# Patient Record
Sex: Female | Born: 1960 | Race: Black or African American | Hispanic: No | Marital: Single | Smoking: Never smoker
Health system: Southern US, Community
[De-identification: ages and names within clinical notes are randomized; demographics above are authoritative.]

## PROBLEM LIST (undated history)

## (undated) DIAGNOSIS — K219 Gastro-esophageal reflux disease without esophagitis: Secondary | ICD-10-CM

## (undated) DIAGNOSIS — G40909 Epilepsy, unspecified, not intractable, without status epilepticus: Secondary | ICD-10-CM

## (undated) DIAGNOSIS — U071 COVID-19: Secondary | ICD-10-CM

## (undated) DIAGNOSIS — G4730 Sleep apnea, unspecified: Secondary | ICD-10-CM

## (undated) DIAGNOSIS — D259 Leiomyoma of uterus, unspecified: Secondary | ICD-10-CM

## (undated) DIAGNOSIS — B007 Disseminated herpesviral disease: Secondary | ICD-10-CM

## (undated) DIAGNOSIS — Z8742 Personal history of other diseases of the female genital tract: Secondary | ICD-10-CM

## (undated) HISTORY — DX: Epilepsy, unspecified, not intractable, without status epilepticus: G40.909

## (undated) HISTORY — DX: COVID-19: U07.1

## (undated) HISTORY — PX: MYOMECTOMY: SHX85

## (undated) HISTORY — DX: Leiomyoma of uterus, unspecified: D25.9

## (undated) HISTORY — DX: Disseminated herpesviral disease: B00.7

## (undated) HISTORY — DX: Personal history of other diseases of the female genital tract: Z87.42

---

## 2010-08-13 DIAGNOSIS — G40909 Epilepsy, unspecified, not intractable, without status epilepticus: Secondary | ICD-10-CM | POA: Insufficient documentation

## 2013-02-28 DIAGNOSIS — R519 Headache, unspecified: Secondary | ICD-10-CM | POA: Insufficient documentation

## 2014-03-31 ENCOUNTER — Ambulatory Visit: Payer: Self-pay | Admitting: Physician Assistant

## 2015-01-22 DIAGNOSIS — E559 Vitamin D deficiency, unspecified: Secondary | ICD-10-CM | POA: Insufficient documentation

## 2015-01-22 DIAGNOSIS — R5383 Other fatigue: Secondary | ICD-10-CM | POA: Insufficient documentation

## 2016-10-15 DIAGNOSIS — G4710 Hypersomnia, unspecified: Secondary | ICD-10-CM | POA: Insufficient documentation

## 2016-10-27 DIAGNOSIS — G4733 Obstructive sleep apnea (adult) (pediatric): Secondary | ICD-10-CM | POA: Insufficient documentation

## 2017-07-28 ENCOUNTER — Encounter: Payer: Self-pay | Admitting: Obstetrics and Gynecology

## 2017-07-28 ENCOUNTER — Ambulatory Visit (INDEPENDENT_AMBULATORY_CARE_PROVIDER_SITE_OTHER): Payer: 59 | Admitting: Obstetrics and Gynecology

## 2017-07-28 VITALS — BP 118/80 | HR 75 | Wt 150.0 lb

## 2017-07-28 DIAGNOSIS — R102 Pelvic and perineal pain: Secondary | ICD-10-CM | POA: Diagnosis not present

## 2017-07-28 DIAGNOSIS — Z1231 Encounter for screening mammogram for malignant neoplasm of breast: Secondary | ICD-10-CM

## 2017-07-28 DIAGNOSIS — Z1211 Encounter for screening for malignant neoplasm of colon: Secondary | ICD-10-CM | POA: Diagnosis not present

## 2017-07-28 DIAGNOSIS — K5900 Constipation, unspecified: Secondary | ICD-10-CM | POA: Diagnosis not present

## 2017-07-28 DIAGNOSIS — D259 Leiomyoma of uterus, unspecified: Secondary | ICD-10-CM

## 2017-07-28 DIAGNOSIS — G40909 Epilepsy, unspecified, not intractable, without status epilepticus: Secondary | ICD-10-CM | POA: Insufficient documentation

## 2017-07-28 DIAGNOSIS — Z0000 Encounter for general adult medical examination without abnormal findings: Secondary | ICD-10-CM | POA: Diagnosis not present

## 2017-07-28 DIAGNOSIS — R140 Abdominal distension (gaseous): Secondary | ICD-10-CM | POA: Diagnosis not present

## 2017-07-28 NOTE — Patient Instructions (Signed)
Abdominal Bloating °When you have abdominal bloating, your abdomen may feel full, tight, or painful. It may also look bigger than normal or swollen (distended). Common causes of abdominal bloating include: °· Swallowing air. °· Constipation. °· Problems digesting food. °· Eating too much. °· Irritable bowel syndrome. This is a condition that affects the large intestine. °· Lactose intolerance. This is an inability to digest lactose, a natural sugar in dairy products. °· Celiac disease. This is a condition that affects the ability to digest gluten, a protein found in some grains. °· Gastroparesis. This is a condition that slows down the movement of food in the stomach and small intestine. It is more common in people with diabetes mellitus. °· Gastroesophageal reflux disease (GERD). This is a digestive condition that makes stomach acid flow back into the esophagus. °· Urinary retention. This means that the body is holding onto urine, and the bladder cannot be emptied all the way. ° °Follow these instructions at home: °Eating and drinking °· Avoid eating too much. °· Try not to swallow air while talking or eating. °· Avoid eating while lying down. °· Avoid these foods and drinks: °? Foods that cause gas, such as broccoli, cabbage, cauliflower, and baked beans. °? Carbonated drinks. °? Hard candy. °? Chewing gum. °Medicines °· Take over-the-counter and prescription medicines only as told by your health care provider. °· Take probiotic medicines. These medicines contain live bacteria or yeasts that can help digestion. °· Take coated peppermint oil capsules. °Activity °· Try to exercise regularly. Exercise may help to relieve bloating that is caused by gas and relieve constipation. °General instructions °· Keep all follow-up visits as told by your health care provider. This is important. °Contact a health care provider if: °· You have nausea and vomiting. °· You have diarrhea. °· You have abdominal pain. °· You have  unusual weight loss or weight gain. °· You have severe pain, and medicines do not help. °Get help right away if: °· You have severe chest pain. °· You have trouble breathing. °· You have shortness of breath. °· You have trouble urinating. °· You have darker urine than normal. °· You have blood in your stools or have dark, tarry stools. °Summary °· Abdominal bloating means that the abdomen is swollen. °· Common causes of abdominal bloating are swallowing air, constipation, and problems digesting food. °· Avoid eating too much and avoid swallowing air. °· Avoid foods that cause gas, carbonated drinks, hard candy, and chewing gum. °This information is not intended to replace advice given to you by your health care provider. Make sure you discuss any questions you have with your health care provider. °Document Released: 11/28/2016 Document Revised: 11/28/2016 Document Reviewed: 11/28/2016 °Elsevier Interactive Patient Education © 2018 [employer] Inc. ° °

## 2017-07-28 NOTE — Progress Notes (Signed)
GYNECOLOGY CLINIC PROGRESS NOTE Subjective:     Dawn Francis is a 55 y.o. P0 female who presents to establish care.  Current complaints: bloating and constipation for ~ 3 years, abdominal/pelvic pain x 1 month.  Pain is mostly on the left side, is occasionally sharp, stabbing. Of note, patient has a h/o fibroid uterus, s/p myomectomy x 2.  She is also postmenopausal, as she reports no menstrual cycles over the past year.  She has not had a GYN appt in over 1 year as she notes her prior GYN practice closed.    Gynecologic History No LMP recorded (lmp unknown). Patient is postmenopausal. Contraception: post menopausal status Last Pap: 11/18/2016. Results were: normal Last mammogram: sometime last year, notes she thinks she is due for one.  Last colonoscopy: patient has never had one.    Obstetric History OB History  Gravida Para Term Preterm AB Living  0 0 0 0 0 0  SAB TAB Ectopic Multiple Live Births  0 0 0 0 0        Past Medical History:  Diagnosis Date  . Seizure disorder [facility])     Past Surgical History:  Procedure Laterality Date  . MYOMECTOMY     x 2    Family History  Problem Relation Age of Onset  . Hypertension Mother   . Hypertension Father   . Seizures Father     Social History   Social History  . Marital status: Unknown    Spouse name: N/A  . Number of children: N/A  . Years of education: N/A   Occupational History  . Not on file.   Social History Main Topics  . Smoking status: Never Smoker  . Smokeless tobacco: Never Used  . Alcohol use No  . Drug use: No  . Sexual activity: Not on file   Other Topics Concern  . Not on file   Social History Narrative  . No narrative on file    No current outpatient prescriptions on file prior to visit.   No current facility-administered medications on file prior to visit.     Allergies  Allergen Reactions  . Prednisone   . Vicodin [Hydrocodone-Acetaminophen]      Review of  Systems Constitutional: negative for chills, fatigue, fevers and sweats Eyes: negative for irritation, redness and visual disturbance Ears, nose, mouth, throat, and face: negative for hearing loss, nasal congestion, snoring and tinnitus Respiratory: negative for asthma, cough, sputum Cardiovascular: negative for chest pain, dyspnea, exertional chest pressure/discomfort, irregular heart beat, palpitations and syncope Gastrointestinal: positive for abdominal bloating and pain, constipation (intermittent episodes). Negative for nausea and vomiting Genitourinary:positive for pelvic pain. Negative for abnormal menstrual periods, genital lesions, sexual problems and vaginal discharge, dysuria and urinary incontinence Integument/breast: negative for breast lump, breast tenderness and nipple discharge Hematologic/lymphatic: negative for bleeding and easy bruising Musculoskeletal:negative for back pain and muscle weakness Neurological: negative for dizziness, headaches, vertigo and weakness Endocrine: negative for diabetic symptoms including polydipsia, polyuria and skin dryness Allergic/Immunologic: negative for hay fever and urticaria       Objective:    BP 118/80 (BP Location: Left Arm, Patient Position: Sitting, Cuff Size: Normal)   Pulse 75   Wt 150 lb (68 kg)   LMP  (LMP Unknown) Comment: over 1 year ago General appearance: alert and no distress Abdomen: soft, non-tender; bowel sounds normal; no masses,  no organomegaly Pelvic: external genitalia normal, rectovaginal septum normal.  Vagina without discharge.  Cervix normal appearing, no lesions and  no motion tenderness.  Uterus mobile, nontender, normal shape and size.  Adnexae non-palpable, mildly tender in left adnexa.  Extremities: extremities normal, atraumatic, no cyanosis or edema Neurologic: Grossly normal    Assessment:   Encounter to establish care Abdominal pain and bloating Constipation Pelvic pain Fibroid uterus    Plan:    Discussed differential diagnosis of pain, including constipation, change in size or number of fibroids, ovarian cyst (as patient has also had a history).   Mammogram ordered for breast cancer screening per guidelines. Discussed colonoscopy as screening tool for colon cancer, as well as may be indicated in workup of abdominal bloating and pain if GYN workup normal. Patient in agreement.  Will scheduled.  Pelvic ultrasound ordered to assess for causes of pelvic pain. Will inform patient of results by phone.  Discussed increasing water and fiber in diet, and OTC stool softeners or laxatives as needed.  Will f/u accordingly based on results of workup and whether or not symptoms improve or worsen.  Rubie Maid, MD [facility]

## 2017-07-30 ENCOUNTER — Encounter: Payer: Self-pay | Admitting: Obstetrics and Gynecology

## 2017-08-05 ENCOUNTER — Ambulatory Visit (INDEPENDENT_AMBULATORY_CARE_PROVIDER_SITE_OTHER): Payer: 59

## 2017-08-05 DIAGNOSIS — D259 Leiomyoma of uterus, unspecified: Secondary | ICD-10-CM | POA: Diagnosis not present

## 2017-08-12 ENCOUNTER — Telehealth: Payer: Self-pay | Admitting: Obstetrics and Gynecology

## 2017-08-12 NOTE — Telephone Encounter (Signed)
Left message for patient regarding ultrasound results.  Patient has a history of fibroids, was following up after several years with no GYN.  Has issues of pelvic pain.   Her results are as follows:  Findings:  The uterus is anteverted and measures 8.2 x 3.6 x 4.6 cm. (Normal size) Echo texture is hetrerogenous with evidence of focal masses. Within the uterus are multiple suspected fibroids measuring: Fibroid 1: 2.4 x 2.2 x 2.5 cm. This appears subserosal and is located at the anterior fundus. Fibroid 2: 1.6 x 1.2 x 1.4 cm. This is subserosal and is located at the posterior fundus.  Normal appearing ovaries.   Summary of results: uterus is normal size, 2 fibroids, 1  approximately the size of a silver dollar, the other the size of a quarter. Normal ovaries and normal uterine lining thickness.  As fibroids are not that large, they may not completely be the cause of her general discomfort and bloating. Would recommend seeing a GI, which I have referred her for.    Rubie Maid, MD [facility]

## 2017-08-13 NOTE — Telephone Encounter (Signed)
Pt aware. GI appt is 10/24.

## 2017-08-13 NOTE — Telephone Encounter (Signed)
Lm asking pt if she received [facility] message.

## 2017-09-02 ENCOUNTER — Encounter: Payer: Self-pay | Admitting: Gastroenterology

## 2017-09-02 ENCOUNTER — Encounter (INDEPENDENT_AMBULATORY_CARE_PROVIDER_SITE_OTHER): Payer: Self-pay

## 2017-09-02 ENCOUNTER — Ambulatory Visit (INDEPENDENT_AMBULATORY_CARE_PROVIDER_SITE_OTHER): Payer: BLUE CROSS/BLUE SHIELD | Admitting: Gastroenterology

## 2017-09-02 VITALS — BP 134/85 | HR 71 | Temp 98.1°F | Ht 64.0 in | Wt 147.2 lb

## 2017-09-02 DIAGNOSIS — K5903 Drug induced constipation: Secondary | ICD-10-CM | POA: Diagnosis not present

## 2017-09-02 DIAGNOSIS — Z1211 Encounter for screening for malignant neoplasm of colon: Secondary | ICD-10-CM

## 2017-09-02 DIAGNOSIS — Z1212 Encounter for screening for malignant neoplasm of rectum: Secondary | ICD-10-CM | POA: Diagnosis not present

## 2017-09-02 NOTE — Progress Notes (Signed)
Dawn Bellows MD, MRCP(U.K) [address]  [place]  [place], [place] [address]  Main: [phone]  Fax: [phone]   Gastroenterology Consultation  Referring Provider:     Rubie Maid, MD Primary Care Physician:  Patient, No Pcp Per Primary Gastroenterologist:  Dr. Jonathon Francis  Reason for Consultation:  Constipation         HPI:   Dawn Francis is a 56 y.o. y/o female referred for constipation.  Constipation: Onset Thinks that in the last 3-5 years has been an issue, getting worse  Frequency of bowel movements once every week Consistency : smooth , firm  Shape : no change in shape of the stools Pain yes  Bloating/abdominal distension yes  Bleeding no  Last colonoscopy no  Weight loss no  Family history of colon cancer no  Diet not much fiber in diet  Narcotic/anticholinergic medications no  Laxative use none  Thyroid abnormalities not checked      Past Medical History:  Diagnosis Date  . Fibroid uterus   . History of ovarian cyst   . Seizure disorder [facility])     Past Surgical History:  Procedure Laterality Date  . MYOMECTOMY     x 2    Prior to Admission medications   Medication Sig Start Date End Date Taking? Authorizing Provider  lamoTRIgine (LAMICTAL) 100 MG tablet Take 100 mg by mouth 2 (two) times daily.   Yes [provider]    Family History  Problem Relation Age of Onset  . Hypertension Mother   . Hypertension Father   . Seizures Father      Social History  Substance Use Topics  . Smoking status: Never Smoker  . Smokeless tobacco: Never Used  . Alcohol use No    Allergies as of 09/02/2017 - Review Complete 09/02/2017  Allergen Reaction Noted  . Prednisone  07/28/2017  . Ranitidine hcl Other (See Comments)   . Vicodin [hydrocodone-acetaminophen]  07/28/2017    Review of Systems:    All systems reviewed and negative except where noted in HPI.   Physical Exam:  BP 134/85 (BP Location: Left Arm, Patient  Position: Sitting, Cuff Size: Normal)   Pulse 71   Temp 98.1 F (36.7 C) (Oral)   Ht 5\' 4"  (1.626 m)   Wt 147 lb 3.2 oz (66.8 kg)   LMP  (LMP Unknown) Comment: over 1 year ago  BMI 25.27 kg/m  No LMP recorded (lmp unknown). Patient is postmenopausal. Psych:  Alert and cooperative. Normal mood and affect. General:   Alert,  Well-developed, well-nourished, pleasant and cooperative in NAD Head:  Normocephalic and atraumatic. Eyes:  Sclera clear, no icterus.   Conjunctiva pink. Ears:  Normal auditory acuity. Nose:  No deformity, discharge, or lesions. Mouth:  No deformity or lesions,oropharynx pink & moist. Neck:  Supple; no masses or thyromegaly. Lungs:  Respirations even and unlabored.  Clear throughout to auscultation.   No wheezes, crackles, or rhonchi. No acute distress. Heart:  Regular rate and rhythm; no murmurs, clicks, rubs, or gallops. Abdomen:  Normal bowel sounds.  No bruits.  Soft, non-tender and non-distended without masses, hepatosplenomegaly or hernias noted.  No guarding or rebound tenderness.    Msk:  Symmetrical without gross deformities. Good, equal movement & strength bilaterally. Pulses:  Normal pulses noted. Extremities:  No clubbing or edema.  No cyanosis. Neurologic:  Alert and oriented x3;  grossly normal neurologically. Skin:  Intact without significant lesions or rashes. No jaundice. Lymph Nodes:  No significant  cervical adenopathy. Psych:  Alert and cooperative. Normal mood and affect.  Imaging Studies: US Pelvis Transvanginal Non-ob (tv Only)  Result Date: 08/10/2017 ULTRASOUND REPORT Location: [facility] Date of Service: 08/05/17 Indications: Pelvic Pain with history of fibroids and myomectomy x 2 Findings: The uterus is anteverted and measures 8.2 x 3.6 x 4.6 cm. Echo texture is hetrerogenous with evidence of focal masses. Within the uterus are multiple suspected fibroids measuring: Fibroid 1: 2.4 x 2.2 x 2.5 cm. This appears subserosal and is  located at the anterior fundus. Fibroid 2: 1.6 x 1.2 x 1.4 cm. This is subserosal and is located at the posterior fundus. The Endometrium measures 3.8 mm. Right Ovary measures 2.1 x 1.4 x 1.8 cm, and appears WNL. Left Ovary measures 2.2 x 1.0 x 1.6 cm, and appears WNL. Survey of the adnexa demonstrates no adnexal masses. There is no free fluid in the cul de sac. Impression: 1. Fibroid uterus. Ovaries and adnexa appear WNL. Recommendations: 1.Clinical correlation with the patient's History and Physical Exam. Macarthur Critchley, RDMS, RVT I have reviewed this study and agree with documented findings. Rubie Maid, MD [facility]    Assessment and Plan:   Dawn Francis is a 56 y.o. y/o female has been referred for chronic constipation .Likely a side effect of lamotrigine. Her timeline of symtoms coincides with commencement of the drug    1. Screening colonoscopy 2. Linzess 145 mcg daily , 2 week samples provided.  3. High fiber diet information provided   I have discussed alternative options, risks & benefits,  which include, but are not limited to, bleeding, infection, perforation,respiratory complication & drug reaction.  The patient agrees with this plan & written consent will be obtained.     Follow up in 8 weeks   Dr Dawn Bellows MD,MRCP(U.K)

## 2017-09-03 ENCOUNTER — Other Ambulatory Visit: Payer: Self-pay

## 2017-09-03 DIAGNOSIS — Z1211 Encounter for screening for malignant neoplasm of colon: Secondary | ICD-10-CM

## 2017-09-15 ENCOUNTER — Encounter: Payer: Self-pay | Admitting: *Deleted

## 2017-09-15 ENCOUNTER — Ambulatory Visit
Admission: RE | Admit: 2017-09-15 | Discharge: 2017-09-15 | Disposition: A | Discharge status: Home / Self Care | Payer: BLUE CROSS/BLUE SHIELD | Source: Ambulatory Visit | Attending: Gastroenterology | Admitting: Gastroenterology

## 2017-09-15 ENCOUNTER — Other Ambulatory Visit: Payer: Self-pay

## 2017-09-15 ENCOUNTER — Encounter
Admission: RE | Disposition: A | Discharge status: Home / Self Care | Payer: Self-pay | Source: Ambulatory Visit | Attending: Gastroenterology

## 2017-09-15 ENCOUNTER — Ambulatory Visit: Payer: BLUE CROSS/BLUE SHIELD | Admitting: Anesthesiology

## 2017-09-15 DIAGNOSIS — Z1211 Encounter for screening for malignant neoplasm of colon: Secondary | ICD-10-CM | POA: Diagnosis not present

## 2017-09-15 DIAGNOSIS — G40909 Epilepsy, unspecified, not intractable, without status epilepticus: Secondary | ICD-10-CM | POA: Insufficient documentation

## 2017-09-15 HISTORY — PX: COLONOSCOPY WITH PROPOFOL: SHX5780

## 2017-09-15 SURGERY — COLONOSCOPY WITH PROPOFOL
Anesthesia: General

## 2017-09-15 MED ORDER — LIDOCAINE 2% (20 MG/ML) 5 ML SYRINGE
INTRAMUSCULAR | Status: DC | PRN
  Administered 2017-09-15: 40 mg via INTRAVENOUS

## 2017-09-15 MED ORDER — SODIUM CHLORIDE 0.9 % IV SOLN
INTRAVENOUS | Status: DC | PRN
  Administered 2017-09-15: 10:00:00 via INTRAVENOUS

## 2017-09-15 MED ORDER — FENTANYL CITRATE (PF) 100 MCG/2ML IJ SOLN
INTRAMUSCULAR | Status: DC | PRN
  Administered 2017-09-15 (×2): 50 ug via INTRAVENOUS

## 2017-09-15 MED ORDER — SODIUM CHLORIDE 0.9 % IV SOLN
INTRAVENOUS | Status: DC
  Administered 2017-09-15: 11:00:00 via INTRAVENOUS

## 2017-09-15 MED ORDER — PHENYLEPHRINE HCL 10 MG/ML IJ SOLN
INTRAMUSCULAR | Status: DC | PRN
  Administered 2017-09-15: 100 ug via INTRAVENOUS

## 2017-09-15 MED ORDER — PROPOFOL 500 MG/50ML IV EMUL
INTRAVENOUS | Status: DC | PRN
  Administered 2017-09-15: 160 ug/kg/min via INTRAVENOUS

## 2017-09-15 MED ORDER — PROPOFOL 10 MG/ML IV BOLUS
INTRAVENOUS | Status: DC | PRN
Start: 2017-09-15 — End: 2017-09-15
  Administered 2017-09-15: 100 mg via INTRAVENOUS

## 2017-09-15 NOTE — Anesthesia Preprocedure Evaluation (Signed)
Anesthesia Evaluation  Patient identified by MRN, date of birth, ID band Patient awake    Reviewed: Allergy & Precautions, H&P , NPO status , Patient's Chart, lab work & pertinent test results  History of Anesthesia Complications Negative for: history of anesthetic complications  Airway Mallampati: II  TM Distance: >3 FB Neck ROM: full    Dental  (+) Chipped   Pulmonary neg pulmonary ROS, neg shortness of breath,           Cardiovascular Exercise Tolerance: Good (-) angina(-) Past MI and (-) DOE negative cardio ROS       Neuro/Psych Seizures -,  negative psych ROS   GI/Hepatic negative GI ROS, Neg liver ROS, neg GERD  ,  Endo/Other  negative endocrine ROS  Renal/GU negative Renal ROS  negative genitourinary   Musculoskeletal   Abdominal   Peds  Hematology negative hematology ROS (+)   Anesthesia Other Findings Past Medical History: No date: Fibroid uterus No date: History of ovarian cyst No date: Seizure disorder ([place])  Past Surgical History: No date: MYOMECTOMY     Comment:  x 2     Reproductive/Obstetrics negative OB ROS                             Anesthesia Physical Anesthesia Plan  ASA: III  Anesthesia Plan: General   Post-op Pain Management:    Induction: Intravenous  PONV Risk Score and Plan: 3 and Propofol infusion  Airway Management Planned: Natural Airway and Nasal Cannula  Additional Equipment:   Intra-op Plan:   Post-operative Plan:   Informed Consent: I have reviewed the patients History and Physical, chart, labs and discussed the procedure including the risks, benefits and alternatives for the proposed anesthesia with the patient or authorized representative who has indicated his/her understanding and acceptance.   Dental Advisory Given  Plan Discussed with: Anesthesiologist, CRNA and Surgeon  Anesthesia Plan Comments: (Patient consented for risks  of anesthesia including but not limited to:  - adverse reactions to medications - risk of intubation if required - damage to teeth, lips or other oral mucosa - sore throat or hoarseness - Damage to heart, brain, lungs or loss of life  Patient voiced understanding.)        Anesthesia Quick Evaluation

## 2017-09-15 NOTE — Op Note (Signed)
[facility] Gastroenterology Patient Name: Dawn Francis Procedure Date: 09/15/2017 10:45 AM MRN: [id number] Account #: [id number] Date of Birth: 1961/07/19 Admit Type: Outpatient Age: 56 Room: [facility] ENDO ROOM 1 Gender: Female Note Status: Finalized Procedure:            Colonoscopy Indications:          Screening for colorectal malignant neoplasm Providers:            Jonathon Bellows MD, MD Referring MD:         Chesley Noon. Marcelline Mates (Referring MD) Medicines:            Monitored Anesthesia Care Complications:        No immediate complications. Procedure:            Pre-Anesthesia Assessment:                       - Prior to the procedure, a History and Physical was                        performed, and patient medications, allergies and                        sensitivities were reviewed. The patient's tolerance of                        previous anesthesia was reviewed.                       - The risks and benefits of the procedure and the                        sedation options and risks were discussed with the                        patient. All questions were answered and informed                        consent was obtained.                       - ASA Grade Assessment: III - A patient with severe                        systemic disease.                       After obtaining informed consent, the colonoscope was                        passed under direct vision. Throughout the procedure,                        the patient's blood pressure, pulse, and oxygen                        saturations were monitored continuously. The                        Colonoscope was introduced through the anus and  advanced to the the cecum, identified by the                        appendiceal orifice, IC valve and transillumination.                        The colonoscopy was performed with ease. The patient                        tolerated the procedure well. The  quality of the bowel                        preparation was good. Findings:      The entire examined colon appeared normal on direct and retroflexion       views. Impression:           - The entire examined colon is normal on direct and                        retroflexion views.                       - No specimens collected. Recommendation:       - Discharge patient to home (with escort).                       - Resume previous diet.                       - Continue present medications.                       - Repeat colonoscopy in 10 years for screening purposes. Procedure Code(s):    --- Professional ---                       [phone], Colonoscopy, flexible; diagnostic, including                        collection of specimen(s) by brushing or washing, when                        performed (separate procedure) Diagnosis Code(s):    --- Professional ---                       Z12.11, Encounter for screening for malignant neoplasm                        of colon CPT copyright 2016 American Medical Association. All rights reserved. The codes documented in this report are preliminary and upon coder review may  be revised to meet current compliance requirements. Jonathon Bellows, MD Jonathon Bellows MD, MD 09/15/2017 11:13:28 AM This report has been signed electronically. Number of Addenda: 0 Note Initiated On: 09/15/2017 10:45 AM Scope Withdrawal Time: 0 hours 11 minutes 56 seconds  Total Procedure Duration: 0 hours 20 minutes 54 seconds       [facility]

## 2017-09-15 NOTE — H&P (Signed)
     Jonathon Bellows, MD [address], [place], [place], [place], [address] [address], [place], [place], [place], [address] Phone: [phone]  Fax: [phone]  Primary Care Physician:  Patient, No Pcp Per   Pre-Procedure History & Physical: HPI:  Dawn Francis is a 56 y.o. female is here for an colonoscopy.   Past Medical History:  Diagnosis Date  . Fibroid uterus   . History of ovarian cyst   . Seizure disorder [facility])     Past Surgical History:  Procedure Laterality Date  . MYOMECTOMY     x 2    Prior to Admission medications   Medication Sig Start Date End Date Taking? Authorizing Provider  lamoTRIgine (LAMICTAL) 100 MG tablet Take 100 mg by mouth 2 (two) times daily.    [provider]    Allergies as of 09/03/2017 - Review Complete 09/02/2017  Allergen Reaction Noted  . Prednisone  07/28/2017  . Ranitidine hcl Other (See Comments)   . Vicodin [hydrocodone-acetaminophen]  07/28/2017    Family History  Problem Relation Age of Onset  . Hypertension Mother   . Hypertension Father   . Seizures Father     Social History   Socioeconomic History  . Marital status: Unknown    Spouse name: Not on file  . Number of children: Not on file  . Years of education: Not on file  . Highest education level: Not on file  Social Needs  . Financial resource strain: Not on file  . Food insecurity - worry: Not on file  . Food insecurity - inability: Not on file  . Transportation needs - medical: Not on file  . Transportation needs - non-medical: Not on file  Occupational History  . Not on file  Tobacco Use  . Smoking status: Never Smoker  . Smokeless tobacco: Never Used  Substance and Sexual Activity  . Alcohol use: No  . Drug use: No  . Sexual activity: Yes  Other Topics Concern  . Not on file  Social History Narrative  . Not on file    Review of Systems: See HPI, otherwise negative ROS  Physical Exam: LMP  (LMP Unknown) Comment: over 1  year ago General:   Alert,  pleasant and cooperative in NAD Head:  Normocephalic and atraumatic. Neck:  Supple; no masses or thyromegaly. Lungs:  Clear throughout to auscultation, normal respiratory effort.    Heart:  +S1, +S2, Regular rate and rhythm, No edema. Abdomen:  Soft, nontender and nondistended. Normal bowel sounds, without guarding, and without rebound.   Neurologic:  Alert and  oriented x4;  grossly normal neurologically.  Impression/Plan: Dawn Francis is here for an colonoscopy to be performed for Screening colonoscopy average risk   Risks, benefits, limitations, and alternatives regarding  colonoscopy have been reviewed with the patient.  Questions have been answered.  All parties agreeable.   Jonathon Bellows, MD  09/15/2017, 9:33 AM

## 2017-09-15 NOTE — Anesthesia Postprocedure Evaluation (Signed)
Anesthesia Post Note  Patient: Dawn Francis  Procedure(s) Performed: COLONOSCOPY WITH PROPOFOL (N/A )  Patient location during evaluation: Endoscopy Anesthesia Type: General Level of consciousness: awake and alert Pain management: pain level controlled Vital Signs Assessment: post-procedure vital signs reviewed and stable Respiratory status: spontaneous breathing, nonlabored ventilation, respiratory function stable and patient connected to nasal cannula oxygen Cardiovascular status: blood pressure returned to baseline and stable Postop Assessment: no apparent nausea or vomiting Anesthetic complications: no     Last Vitals:  Vitals:   09/15/17 1120 09/15/17 1148  BP: (!) 101/59 129/81  Pulse: 79   Resp: (!) 21   Temp: 37 C   SpO2: 99%     Last Pain:  Vitals:   09/15/17 1118  TempSrc: Tympanic                 Precious Haws Khamiyah Grefe

## 2017-09-15 NOTE — Transfer of Care (Signed)
Immediate Anesthesia Transfer of Care Note  Patient: Dawn Francis  Procedure(s) Performed: COLONOSCOPY WITH PROPOFOL (N/A )  Patient Location: PACU and Endoscopy Unit  Anesthesia Type:General  Level of Consciousness: drowsy  Airway & Oxygen Therapy: Patient Spontanous Breathing and Patient connected to nasal cannula oxygen  Post-op Assessment: Report given to RN and Post -op Vital signs reviewed and stable  Post vital signs: Reviewed and stable  Last Vitals:  Vitals:   09/15/17 1015 09/15/17 1118  BP: 125/81 (!) 101/59  Pulse: 76 73  Resp: 16 10  Temp: 36.9 C (!) 36.2 C  SpO2: 100% 98%    Last Pain:  Vitals:   09/15/17 1118  TempSrc: Tympanic         Complications: No apparent anesthesia complications

## 2017-09-15 NOTE — Anesthesia Post-op Follow-up Note (Signed)
Anesthesia QCDR form completed.        

## 2017-09-16 ENCOUNTER — Encounter: Payer: Self-pay | Admitting: Gastroenterology

## 2018-01-16 ENCOUNTER — Other Ambulatory Visit: Payer: Self-pay

## 2018-01-16 ENCOUNTER — Emergency Department: Payer: BLUE CROSS/BLUE SHIELD

## 2018-01-16 ENCOUNTER — Emergency Department
Admission: EM | Admit: 2018-01-16 | Discharge: 2018-01-16 | Disposition: A | Discharge status: Home / Self Care | Payer: BLUE CROSS/BLUE SHIELD | Attending: Emergency Medicine | Admitting: Emergency Medicine

## 2018-01-16 ENCOUNTER — Encounter: Payer: Self-pay | Admitting: Emergency Medicine

## 2018-01-16 DIAGNOSIS — R1011 Right upper quadrant pain: Secondary | ICD-10-CM | POA: Insufficient documentation

## 2018-01-16 DIAGNOSIS — Z79899 Other long term (current) drug therapy: Secondary | ICD-10-CM | POA: Insufficient documentation

## 2018-01-16 DIAGNOSIS — R1013 Epigastric pain: Secondary | ICD-10-CM | POA: Diagnosis not present

## 2018-01-16 DIAGNOSIS — N201 Calculus of ureter: Secondary | ICD-10-CM | POA: Diagnosis not present

## 2018-01-16 LAB — CBC
HCT: 40.6 % (ref 35.0–47.0)
HEMOGLOBIN: 13.8 g/dL (ref 12.0–16.0)
MCH: 30.5 pg (ref 26.0–34.0)
MCHC: 34 g/dL (ref 32.0–36.0)
MCV: 89.9 fL (ref 80.0–100.0)
PLATELETS: 279 10*3/uL (ref 150–440)
RBC: 4.52 MIL/uL (ref 3.80–5.20)
RDW: 12.8 % (ref 11.5–14.5)
WBC: 4 10*3/uL (ref 3.6–11.0)

## 2018-01-16 LAB — COMPREHENSIVE METABOLIC PANEL
ALT: 17 U/L (ref 14–54)
AST: 21 U/L (ref 15–41)
Albumin: 4.5 g/dL (ref 3.5–5.0)
Alkaline Phosphatase: 41 U/L (ref 38–126)
Anion gap: 7 (ref 5–15)
BUN: 17 mg/dL (ref 6–20)
CALCIUM: 9.4 mg/dL (ref 8.9–10.3)
CHLORIDE: 104 mmol/L (ref 101–111)
CO2: 28 mmol/L (ref 22–32)
CREATININE: 1.09 mg/dL — AB (ref 0.44–1.00)
GFR, EST NON AFRICAN AMERICAN: 56 mL/min — AB (ref >60)
Glucose, Bld: 101 mg/dL — ABNORMAL HIGH (ref 65–99)
Potassium: 4 mmol/L (ref 3.5–5.1)
SODIUM: 139 mmol/L (ref 135–145)
Total Bilirubin: 0.9 mg/dL (ref 0.3–1.2)
Total Protein: 7.6 g/dL (ref 6.5–8.1)

## 2018-01-16 LAB — URINALYSIS, COMPLETE (UACMP) WITH MICROSCOPIC
BILIRUBIN URINE: NEGATIVE
Bacteria, UA: NONE SEEN
GLUCOSE, UA: NEGATIVE mg/dL
KETONES UR: NEGATIVE mg/dL
LEUKOCYTES UA: NEGATIVE
Nitrite: NEGATIVE
PH: 7 (ref 5.0–8.0)
PROTEIN: NEGATIVE mg/dL
Specific Gravity, Urine: 1.011 (ref 1.005–1.030)

## 2018-01-16 LAB — TROPONIN I: Troponin I: <0.03 ng/mL (ref <0.03)

## 2018-01-16 LAB — LIPASE, BLOOD: LIPASE: 28 U/L (ref 11–51)

## 2018-01-16 MED ORDER — ALUM & MAG HYDROXIDE-SIMETH 200-200-20 MG/5ML PO SUSP
15.0000 mL | Freq: Once | ORAL | Status: AC
Start: 2018-01-16 — End: 2018-01-16
  Administered 2018-01-16: 15 mL via ORAL
  Filled 2018-01-16: qty 30

## 2018-01-16 NOTE — ED Notes (Signed)
Pt back from US

## 2018-01-16 NOTE — ED Triage Notes (Signed)
Pt to ed with c/o upper abd pain x 1 week, intermittent.  Denies n/v

## 2018-01-16 NOTE — ED Provider Notes (Signed)
[facility] Emergency Department Provider Note   ____________________________________________   First MD Initiated Contact with Patient 01/16/18 [phone]     (approximate)  I have reviewed the triage vital signs and the nursing notes.   HISTORY  Chief Complaint Abdominal Pain    HPI Dawn Francis is a 57 y.o. female here for evaluation of upper abdominal pain for about 1 week.  Patient reports she has been having pain that comes and goes located in her upper abdomen, pointing to her epigastrium off and on for about 1 week's time.  No nausea or vomiting.  No trouble urinating.  No fevers or chills.  Denies chest pain or trouble breathing.  Currently reports her pain is very mild, seems to have improved once again.  The pain will come and go last for several minutes to seconds.  Sometimes somewhat sharp, somewhat hard to describe in nature.  Does not radiate or go to the back.  No numbness tingling or weakness.  She is been able to go to work, but reports it is intermittent very uncomfortable.  No history of any blood clots.  No long trips or travel.  No leg swelling.  No calf or thigh pain.  Denies pregnancy.  Reports currently going through menopause, has not been sexually active for over 2 years.  Past Medical History:  Diagnosis Date  . Fibroid uterus   . History of ovarian cyst   . Seizure disorder [facility])     Patient Active Problem List   Diagnosis Date Noted  . Seizure disorder ([place]) 07/28/2017  . Uterine leiomyoma 07/28/2017    Past Surgical History:  Procedure Laterality Date  . COLONOSCOPY WITH PROPOFOL N/A 09/15/2017   Procedure: COLONOSCOPY WITH PROPOFOL;  Surgeon: Jonathon Bellows, MD;  Location: [facility] ENDOSCOPY;  Service: Gastroenterology;  Laterality: N/A;  . MYOMECTOMY     x 2    Prior to Admission medications   Medication Sig Start Date End Date Taking? Authorizing Provider  amoxicillin (AMOXIL) 500 MG capsule Take 500 mg by mouth 3  (three) times daily. 01/15/18 01/22/18 Yes [provider]  lamoTRIgine (LAMICTAL) 100 MG tablet Take 100 mg by mouth 2 (two) times daily.   Yes [provider]    Allergies Prednisone; Ranitidine hcl; and Vicodin [hydrocodone-acetaminophen]  Family History  Problem Relation Age of Onset  . Hypertension Mother   . Hypertension Father   . Seizures Father     Social History Social History   Tobacco Use  . Smoking status: Never Smoker  . Smokeless tobacco: Never Used  Substance Use Topics  . Alcohol use: No  . Drug use: No    Review of Systems Constitutional: No fever/chills Eyes: No visual changes. ENT: No sore throat. Cardiovascular: Denies chest pain. Respiratory: Denies shortness of breath. Gastrointestinal:  No nausea, no vomiting.  No diarrhea.  Does not feel swollen or constipated. Genitourinary: Negative for dysuria. Musculoskeletal: Negative for back pain. Skin: Negative for rash. Neurological: Negative for headaches, focal weakness or numbness.    ____________________________________________   PHYSICAL EXAM:  VITAL SIGNS: ED Triage Vitals  Enc Vitals Group     BP 01/16/18 0800 (!) 151/88     Pulse Rate 01/16/18 0800 82     Resp 01/16/18 0800 16     Temp 01/16/18 0800 98 F (36.7 C)     Temp Source 01/16/18 0800 Oral     SpO2 01/16/18 0800 95 %     Weight 01/16/18 0801 150 lb (  68 kg)     Height 01/16/18 0801 5\' 5"  (1.651 m)     Head Circumference --      Peak Flow --      Pain Score 01/16/18 0801 8     Pain Loc --      Pain Edu? --      Excl. in [place]? --     Constitutional: Alert and oriented. Well appearing and in no acute distress.  She is very pleasant. Eyes: Conjunctivae are normal. Head: Atraumatic. Nose: No congestion/rhinnorhea. Mouth/Throat: Mucous membranes are moist. Neck: No stridor.   Cardiovascular: Normal rate, regular rhythm. Grossly normal heart sounds.  Good peripheral circulation. Respiratory: Normal  respiratory effort.  No retractions. Lungs CTAB. Gastrointestinal: Soft and nontender. No distention. Musculoskeletal: No lower extremity tenderness nor edema. Neurologic:  Normal speech and language. No gross focal neurologic deficits are appreciated.  Skin:  Skin is warm, dry and intact. No rash noted. Psychiatric: Mood and affect are normal. Speech and behavior are normal.  ____________________________________________   LABS (all labs ordered are listed, but only abnormal results are displayed)  Labs Reviewed  COMPREHENSIVE METABOLIC PANEL - Abnormal; Notable for the following components:      Result Value   Glucose, Bld 101 (*)    Creatinine, Ser 1.09 (*)    GFR calc non Af Amer 56 (*)    All other components within normal limits  URINALYSIS, COMPLETE (UACMP) WITH MICROSCOPIC - Abnormal; Notable for the following components:   Color, Urine STRAW (*)    APPearance HAZY (*)    Hgb urine dipstick SMALL (*)    Squamous Epithelial / LPF 0-5 (*)    All other components within normal limits  LIPASE, BLOOD  CBC  TROPONIN I   ____________________________________________  EKG  Reviewed and her by me at 8:10 AM Heart rate 75 QRS 85 QTC 440 Normal sinus rhythm, no evidence of ischemia ____________________________________________  RADIOLOGY    Reviewed the abdominal ultrasound negative for acute.  Chest x-ray reviewed, radiologist reports moderate stool in the colon.  No bowel obstruction.  Questionable 3 or 4 mm stone in the deep right pelvis.  See full radiology report ____________________________________________   PROCEDURES  Procedure(s) performed: None  Procedures  Critical Care performed: No  ____________________________________________   INITIAL IMPRESSION / ASSESSMENT AND PLAN / ED COURSE  Pertinent labs & imaging results that were available during my care of the patient were reviewed by me and considered in my medical decision making (see chart for  details).  Patient presents for intermittent epigastric abdominal discomfort off and on for about 1 week.  Currently her pain is improved, but reports will come and go.  Very reassuring exam without any evidence of peritonitis.  Denies chest pain or pulmonary symptoms.  No risk factors for pulmonary embolism denoted.  Denies any symptoms of dyspnea.  Reassuring EKG and normal troponin, very atypical symptoms do not sound like they are cardiac in etiology.  Reassuring blood work and lab work.  Ultrasound does not demonstrate acute intra-abdominal etiology, x-rays demonstrate no obvious acute etiology.  Symptoms do not seem to fit that of a kidney stone.  No infectious symptoms.  ----------------------------------------- 11:04 AM on 01/16/2018 -----------------------------------------  I discussed with the patient the risks and benefits of abdominal CT scan. The present time there is no clear indication that the patient requires CT, the patient does have an abdominal complaint but exam does not suggest acute surgical abdomen and my suspicion for intra-abdominal infection  including appendicitis, cholecystitis, aaa, dissection, ischemia, perforation, pancreatitis, diverticulitis or other acute major intra-abdominal process is quite low. After discussing the risks and benefits including benefits of additional evaluation for diagnoses, ruling out infection/perforation/aaa/etc, but also discussing the risks including low, "well less than 1%," but not 0 risk of inducing cancers due to radiation and potential risks of contrast the patient indicated via our shared medical decision-making that she would not do a CAT scan. Rather if the patient does have worsening symptoms, develops a high fever, develops pain or persistent discomfort in the right upper quadrant or right lower quadrant, or other new concerns arise they will come back to emergency room right away. As the patient's clinician I think this is a very  reasonable decision having discussed general risks and benefits of CT, and my clinical suspicion that CT would be of benefit at this time is very low.  Patient reports he feels much better.  Currently pain-free.  Like to be able to follow-up with her doctor, feels she may be having some constipation she reports she had feeling like this with constipation in the past she did try some MiraLAX.  No evidence to suggest acute bleeding.  Discussed with the patient, careful return precautions and follow-up.  Appears appropriate for ongoing outpatient care.  Return precautions and treatment recommendations and follow-up discussed with the patient who is agreeable with the plan.       ____________________________________________   FINAL CLINICAL IMPRESSION(S) / ED DIAGNOSES  Final diagnoses:  Epigastric pain      NEW MEDICATIONS STARTED DURING THIS VISIT:  New Prescriptions   No medications on file     Note:  This document was prepared using Dragon voice recognition software and may include unintentional dictation errors.     Delman Kitten, MD 01/16/18 1105

## 2018-01-16 NOTE — Discharge Instructions (Signed)
You were seen in the emergency room for abdominal pain. It is important that you follow up closely with your primary care doctor in the next couple of days. ° ° °Please return to the emergency room right away if you are to develop a fever, severe nausea, your pain becomes severe or worsens, you are unable to keep food down, begin vomiting any dark or bloody fluid, you develop any dark or bloody stools, feel dehydrated, or other new concerns or symptoms arise. ° ° °

## 2018-01-16 NOTE — ED Notes (Signed)
Pt back from x-ray.

## 2018-01-26 ENCOUNTER — Ambulatory Visit: Payer: BLUE CROSS/BLUE SHIELD | Admitting: Gastroenterology

## 2018-01-27 ENCOUNTER — Encounter: Payer: Self-pay | Admitting: Gastroenterology

## 2018-01-27 ENCOUNTER — Ambulatory Visit (INDEPENDENT_AMBULATORY_CARE_PROVIDER_SITE_OTHER): Payer: BLUE CROSS/BLUE SHIELD | Admitting: Gastroenterology

## 2018-01-27 VITALS — BP 132/91 | HR 76 | Ht 64.0 in | Wt 147.2 lb

## 2018-01-27 DIAGNOSIS — R1013 Epigastric pain: Secondary | ICD-10-CM | POA: Diagnosis not present

## 2018-01-27 DIAGNOSIS — K5903 Drug induced constipation: Secondary | ICD-10-CM

## 2018-01-27 DIAGNOSIS — G8929 Other chronic pain: Secondary | ICD-10-CM

## 2018-01-27 MED ORDER — CALCIUM CARBONATE ANTACID 500 MG PO CHEW: 1.0000 | CHEWABLE_TABLET | Freq: Two times a day (BID) | ORAL | 1 refills | Status: AC

## 2018-01-27 MED ORDER — LINACLOTIDE 145 MCG PO CAPS: 145.0000 ug | ORAL_CAPSULE | Freq: Every day | ORAL | 1 refills | Status: DC

## 2018-01-27 NOTE — Progress Notes (Signed)
Jonathon Bellows MD, MRCP(U.K) [address]  [place]  [place], [place] [address]  Main: [phone]  Fax: [phone]   Primary Care Physician: Patient, No Pcp Per  Primary Gastroenterologist:  Dr. Jonathon Bellows   No chief complaint on file.   HPI: Janeann Paisley is a 57 y.o. female   Summary of history :  She was initially referred and seen on 09/02/18 for constipation which had been ongoing for 3-5 years , had a bowel movement once a week , firm in nature associated with abdominal distension , I commenced her on linzess 145 mch with 2 weeks of samples.   Interval history   09/02/2017-  01/27/2018    09/15/17 : colonoscopy -normal . She was seen at the ER on 01/16/18 for abdominal pain , pain resolved at the ER and was sent home on miralax for presumed constipation   Took the course of linzess - while on the medications- 3 bowel movements a week and the abdominal pain had got better. Then she stopped the medications and last 3 weeks the pain came back in a different area in her epigastrium . She was at that was taking some naprosyn was taking it along with antibiotics for tooth issues- took it for many days till yesterday . No acid supressants. Pain is better today. When she had the pain , would be woken up form her sleep. Occurred on and off every 15 minutes.    Current Outpatient Medications  Medication Sig Dispense Refill  . lamoTRIgine (LAMICTAL) 100 MG tablet Take 100 mg by mouth 2 (two) times daily.     No current facility-administered medications for this visit.     Allergies as of 01/27/2018 - Review Complete 01/16/2018  Allergen Reaction Noted  . Prednisone  07/28/2017  . Ranitidine hcl Other (See Comments)   . Vicodin [hydrocodone-acetaminophen]  07/28/2017    ROS:  General: Negative for anorexia, weight loss, fever, chills, fatigue, weakness. ENT: Negative for hoarseness, difficulty swallowing , nasal congestion. CV: Negative for chest pain, angina,  palpitations, dyspnea on exertion, peripheral edema.  Respiratory: Negative for dyspnea at rest, dyspnea on exertion, cough, sputum, wheezing.  GI: See history of present illness. GU:  Negative for dysuria, hematuria, urinary incontinence, urinary frequency, nocturnal urination.  Endo: Negative for unusual weight change.    Physical Examination:   LMP  (LMP Unknown) Comment: over 1 year ago  General: Well-nourished, well-developed in no acute distress.  Eyes: No icterus. Conjunctivae pink. Mouth: Oropharyngeal mucosa moist and pink , no lesions erythema or exudate. Lungs: Clear to auscultation bilaterally. Non-labored. Heart: Regular rate and rhythm, no murmurs rubs or gallops.  Abdomen: Bowel sounds are normal, mild epigastric tenderness, nondistended, no hepatosplenomegaly or masses, no abdominal bruits or hernia , no rebound or guarding.   Extremities: No lower extremity edema. No clubbing or deformities. Neuro: Alert and oriented x 3.  Grossly intact. Skin: Warm and dry, no jaundice.   Psych: Alert and cooperative, normal mood and affect.   Imaging Studies: US Abdomen Complete  Result Date: 01/16/2018 CLINICAL DATA:  Right upper quadrant pain EXAM: ABDOMEN ULTRASOUND COMPLETE COMPARISON:  Plain films earlier today FINDINGS: Gallbladder: No gallstones or wall thickening visualized. No sonographic Murphy sign noted by sonographer. Common bile duct: Diameter: Normal caliber, 3-4 mm Liver: Normal echotexture. Small cyst in the right hepatic lobe measures up to 1.2 cm. No biliary ductal dilatation. Portal vein is patent on color Doppler imaging with normal direction of blood flow towards  the liver. IVC: No abnormality visualized. Pancreas: Visualized portion unremarkable. Spleen: Size and appearance within normal limits. Right Kidney: Length: 8.5 cm. Echogenicity within normal limits. No mass or hydronephrosis visualized. Left Kidney: Length: 9.2 cm. Echogenicity within normal limits. No mass  or hydronephrosis visualized. Abdominal aorta: No aneurysm visualized. Other findings: None. IMPRESSION: No acute findings in the abdomen. Electronically Signed   By: Rolm Baptise M.D.   On: 01/16/2018 10:16   Dg Abdomen Acute W/chest  Result Date: 01/16/2018 CLINICAL DATA:  Upper abdominal pain for 7 days. EXAM: DG ABDOMEN ACUTE W/ 1V CHEST COMPARISON:  Chest x-ray Mar 31, 2014 FINDINGS: Chest is normal. Moderate fecal loading throughout the colon. No bowel obstruction. No free air, portal venous gas, or pneumatosis. No renal stones noted. A tiny calcification in the deep right pelvis measuring 3 or 4 mm is identified. IMPRESSION: 1. Moderate fecal loading throughout the colon. No bowel obstruction. 2. Three or 4 mm stone in the deep right pelvis could represent a phlebolith. A small ureteral stone cannot be excluded in the appropriate clinical setting. Electronically Signed   By: Dorise Bullion III M.D   On: 01/16/2018 09:46    Assessment and Plan:   Walker Paddack is a 57 y.o. y/o female  f+or abdominal pain felt secondary to severe constipation .Felt initially to be secondary to lamotrigine as her symptoms began after commencement of the drug.  In addition since her last visit developed some epigastric pain likely secondary to NSAID usage, pain better now that she has gone off all NSAID;s .    Plan  1. Stop all nsaid's 2. Start on TUMS as zantac reacts with her lamotrigine.  3. Script for liinzes provided and advised to take daily for constipation. Cannot be used PRN   Dr Jonathon Bellows  MD,MRCP [facility]) Follow up in 6 weeks  +

## 2018-03-17 ENCOUNTER — Ambulatory Visit: Payer: BLUE CROSS/BLUE SHIELD | Admitting: Gastroenterology

## 2018-03-17 ENCOUNTER — Encounter: Payer: Self-pay | Admitting: Gastroenterology

## 2018-05-05 ENCOUNTER — Ambulatory Visit (INDEPENDENT_AMBULATORY_CARE_PROVIDER_SITE_OTHER): Payer: BLUE CROSS/BLUE SHIELD | Admitting: Gastroenterology

## 2018-05-05 ENCOUNTER — Encounter: Payer: Self-pay | Admitting: Gastroenterology

## 2018-05-05 VITALS — BP 133/86 | HR 75 | Ht 64.0 in | Wt 145.6 lb

## 2018-05-05 DIAGNOSIS — K5903 Drug induced constipation: Secondary | ICD-10-CM

## 2018-05-05 MED ORDER — LINACLOTIDE 72 MCG PO CAPS: 72.0000 ug | ORAL_CAPSULE | Freq: Every day | ORAL | 2 refills | Status: DC

## 2018-05-05 NOTE — Addendum Note (Signed)
Addended by: Peggye Ley on: 05/05/2018 09:39 AM   Modules accepted: Orders

## 2018-05-05 NOTE — Progress Notes (Signed)
   Jonathon Bellows MD, MRCP(U.K) [address]  [place]  [place], [place] [address]  Main: [phone]  Fax: [phone]   Primary Care Physician: Patient, No Pcp Per  Primary Gastroenterologist:  Dr. Jonathon Bellows   No chief complaint on file.   HPI: Kenzleigh Sedam is a 57 y.o. female   Summary of history :  She was initially referred and seen on 09/02/18 for constipation which had been ongoing for 3-5 years , had a bowel movement once a week , firm in nature associated with abdominal distension , I commenced her on linzess 145 mch with 2 weeks of samples.  09/15/17 : colonoscopy -normal . She was seen at the ER on 01/16/18 for abdominal pain , pain resolved at the ER and was sent home on miralax for presumed constipation   Interval history  01/27/2018-04/2018   Took Linzess 145 mcg daily and was having diarrhea- started taking every other day - works not as well. Usually works in 24 hours. Only abdominal pain is when she is constipated. On lamotrigine.    Current Outpatient Medications  Medication Sig Dispense Refill  . amoxicillin (AMOXIL) 500 MG capsule TK 1 C PO TID UNTIL GONE  0  . lamoTRIgine (LAMICTAL) 100 MG tablet Take 100 mg by mouth 2 (two) times daily.    Marland Kitchen linaclotide (LINZESS) 145 MCG CAPS capsule Take 1 capsule (145 mcg total) by mouth daily before breakfast. 92 capsule 1   No current facility-administered medications for this visit.     Allergies as of 05/05/2018 - Review Complete 01/27/2018  Allergen Reaction Noted  . Prednisone  07/28/2017  . Ranitidine hcl Other (See Comments)   . Vicodin [hydrocodone-acetaminophen]  07/28/2017    ROS:  General: Negative for anorexia, weight loss, fever, chills, fatigue, weakness. ENT: Negative for hoarseness, difficulty swallowing , nasal congestion. CV: Negative for chest pain, angina, palpitations, dyspnea on exertion, peripheral edema.  Respiratory: Negative for dyspnea at rest, dyspnea on exertion, cough,  sputum, wheezing.  GI: See history of present illness. GU:  Negative for dysuria, hematuria, urinary incontinence, urinary frequency, nocturnal urination.  Endo: Negative for unusual weight change.    Physical Examination:   LMP  (LMP Unknown) Comment: over 1 year ago  General: Well-nourished, well-developed in no acute distress.  Eyes: No icterus. Conjunctivae pink. Mouth: Oropharyngeal mucosa moist and pink , no lesions erythema or exudate. Lungs: Clear to auscultation bilaterally. Non-labored. Heart: Regular rate and rhythm, no murmurs rubs or gallops.  Abdomen: Bowel sounds are normal, nontender, nondistended, no hepatosplenomegaly or masses, no abdominal bruits or hernia , no rebound or guarding.   Extremities: No lower extremity edema. No clubbing or deformities. Neuro: Alert and oriented x 3.  Grossly intact. Skin: Warm and dry, no jaundice.   Psych: Alert and cooperative, normal mood and affect.   Imaging Studies: No results found.  Assessment and Plan:   Lenyx Boody is a 57 y.o. y/o female for likely drug induced constipation .Felt initially to be secondary to lamotrigine as her symptoms began after commencement of the drug.  Commenced on linzess 145 mcg in 01/2018 - works really well and at times caused diarrhea. Will decrease dose to 72 mcg, 2 weeks samples provided. She will call for refill.    Dr Jonathon Bellows  MD,MRCP [facility]) Follow up PRN

## 2018-10-20 ENCOUNTER — Encounter: Payer: Self-pay | Admitting: Obstetrics and Gynecology

## 2018-10-20 ENCOUNTER — Ambulatory Visit (INDEPENDENT_AMBULATORY_CARE_PROVIDER_SITE_OTHER): Payer: BLUE CROSS/BLUE SHIELD | Admitting: Obstetrics and Gynecology

## 2018-10-20 ENCOUNTER — Other Ambulatory Visit (HOSPITAL_COMMUNITY)
Admission: RE | Admit: 2018-10-20 | Discharge: 2018-10-20 | Disposition: A | Discharge status: Home / Self Care | Payer: BLUE CROSS/BLUE SHIELD | Source: Ambulatory Visit | Attending: Obstetrics and Gynecology | Admitting: Obstetrics and Gynecology

## 2018-10-20 VITALS — BP 130/75 | HR 77 | Ht 65.0 in | Wt 143.1 lb

## 2018-10-20 DIAGNOSIS — Z124 Encounter for screening for malignant neoplasm of cervix: Secondary | ICD-10-CM

## 2018-10-20 DIAGNOSIS — Z01419 Encounter for gynecological examination (general) (routine) without abnormal findings: Secondary | ICD-10-CM

## 2018-10-20 DIAGNOSIS — Z1231 Encounter for screening mammogram for malignant neoplasm of breast: Secondary | ICD-10-CM | POA: Diagnosis not present

## 2018-10-20 DIAGNOSIS — Z113 Encounter for screening for infections with a predominantly sexual mode of transmission: Secondary | ICD-10-CM | POA: Diagnosis not present

## 2018-10-20 DIAGNOSIS — Z13220 Encounter for screening for lipoid disorders: Secondary | ICD-10-CM | POA: Diagnosis not present

## 2018-10-20 NOTE — Patient Instructions (Signed)
Health Maintenance for Postmenopausal Women Menopause is a normal process in which your reproductive ability comes to an end. This process happens gradually over a span of months to years, usually between the ages of 22 and 9. Menopause is complete when you have missed 12 consecutive menstrual periods. It is important to talk with your health care provider about some of the most common conditions that affect postmenopausal women, such as heart disease, cancer, and bone loss (osteoporosis). Adopting a healthy lifestyle and getting preventive care can help to promote your health and wellness. Those actions can also lower your chances of developing some of these common conditions. What should I know about menopause? During menopause, you may experience a number of symptoms, such as:  Moderate-to-severe hot flashes.  Night sweats.  Decrease in sex drive.  Mood swings.  Headaches.  Tiredness.  Irritability.  Memory problems.  Insomnia.  Choosing to treat or not to treat menopausal changes is an individual decision that you make with your health care provider. What should I know about hormone replacement therapy and supplements? Hormone therapy products are effective for treating symptoms that are associated with menopause, such as hot flashes and night sweats. Hormone replacement carries certain risks, especially as you become older. If you are thinking about using estrogen or estrogen with progestin treatments, discuss the benefits and risks with your health care provider. What should I know about heart disease and stroke? Heart disease, heart attack, and stroke become more likely as you age. This may be due, in part, to the hormonal changes that your body experiences during menopause. These can affect how your body processes dietary fats, triglycerides, and cholesterol. Heart attack and stroke are both medical emergencies. There are many things that you can do to help prevent heart disease  and stroke:  Have your blood pressure checked at least every 1-2 years. High blood pressure causes heart disease and increases the risk of stroke.  If you are 53-22 years old, ask your health care provider if you should take aspirin to prevent a heart attack or a stroke.  Do not use any tobacco products, including cigarettes, chewing tobacco, or electronic cigarettes. If you need help quitting, ask your health care provider.  It is important to eat a healthy diet and maintain a healthy weight. ? Be sure to include plenty of vegetables, fruits, low-fat dairy products, and lean protein. ? Avoid eating foods that are high in solid fats, added sugars, or salt (sodium).  Get regular exercise. This is one of the most important things that you can do for your health. ? Try to exercise for at least 150 minutes each week. The type of exercise that you do should increase your heart rate and make you sweat. This is known as moderate-intensity exercise. ? Try to do strengthening exercises at least twice each week. Do these in addition to the moderate-intensity exercise.  Know your numbers.Ask your health care provider to check your cholesterol and your blood glucose. Continue to have your blood tested as directed by your health care provider.  What should I know about cancer screening? There are several types of cancer. Take the following steps to reduce your risk and to catch any cancer development as early as possible. Breast Cancer  Practice breast self-awareness. ? This means understanding how your breasts normally appear and feel. ? It also means doing regular breast self-exams. Let your health care provider know about any changes, no matter how small.  If you are 40  or older, have a clinician do a breast exam (clinical breast exam or CBE) every year. Depending on your age, family history, and medical history, it may be recommended that you also have a yearly breast X-ray (mammogram).  If you  have a family history of breast cancer, talk with your health care provider about genetic screening.  If you are at high risk for breast cancer, talk with your health care provider about having an MRI and a mammogram every year.  Breast cancer (BRCA) gene test is recommended for women who have family members with BRCA-related cancers. Results of the assessment will determine the need for genetic counseling and BRCA1 and for BRCA2 testing. BRCA-related cancers include these types: ? Breast. This occurs in males or females. ? Ovarian. ? Tubal. This may also be called fallopian tube cancer. ? Cancer of the abdominal or pelvic lining (peritoneal cancer). ? Prostate. ? Pancreatic.  Cervical, Uterine, and Ovarian Cancer Your health care provider may recommend that you be screened regularly for cancer of the pelvic organs. These include your ovaries, uterus, and vagina. This screening involves a pelvic exam, which includes checking for microscopic changes to the surface of your cervix (Pap test).  For women ages 21-65, health care providers may recommend a pelvic exam and a Pap test every three years. For women ages 79-65, they may recommend the Pap test and pelvic exam, combined with testing for human papilloma virus (HPV), every five years. Some types of HPV increase your risk of cervical cancer. Testing for HPV may also be done on women of any age who have unclear Pap test results.  Other health care providers may not recommend any screening for nonpregnant women who are considered low risk for pelvic cancer and have no symptoms. Ask your health care provider if a screening pelvic exam is right for you.  If you have had past treatment for cervical cancer or a condition that could lead to cancer, you need Pap tests and screening for cancer for at least 20 years after your treatment. If Pap tests have been discontinued for you, your risk factors (such as having a new sexual partner) need to be  reassessed to determine if you should start having screenings again. Some women have medical problems that increase the chance of getting cervical cancer. In these cases, your health care provider may recommend that you have screening and Pap tests more often.  If you have a family history of uterine cancer or ovarian cancer, talk with your health care provider about genetic screening.  If you have vaginal bleeding after reaching menopause, tell your health care provider.  There are currently no reliable tests available to screen for ovarian cancer.  Lung Cancer Lung cancer screening is recommended for adults 69-62 years old who are at high risk for lung cancer because of a history of smoking. A yearly low-dose CT scan of the lungs is recommended if you:  Currently smoke.  Have a history of at least 30 pack-years of smoking and you currently smoke or have quit within the past 15 years. A pack-year is smoking an average of one pack of cigarettes per day for one year.  Yearly screening should:  Continue until it has been 15 years since you quit.  Stop if you develop a health problem that would prevent you from having lung cancer treatment.  Colorectal Cancer  This type of cancer can be detected and can often be prevented.  Routine colorectal cancer screening usually begins at  age 42 and continues through age 45.  If you have risk factors for colon cancer, your health care provider may recommend that you be screened at an earlier age.  If you have a family history of colorectal cancer, talk with your health care provider about genetic screening.  Your health care provider may also recommend using home test kits to check for hidden blood in your stool.  A small camera at the end of a tube can be used to examine your colon directly (sigmoidoscopy or colonoscopy). This is done to check for the earliest forms of colorectal cancer.  Direct examination of the colon should be repeated every  5-10 years until age 71. However, if early forms of precancerous polyps or small growths are found or if you have a family history or genetic risk for colorectal cancer, you may need to be screened more often.  Skin Cancer  Check your skin from head to toe regularly.  Monitor any moles. Be sure to tell your health care provider: ? About any new moles or changes in moles, especially if there is a change in a mole's shape or color. ? If you have a mole that is larger than the size of a pencil eraser.  If any of your family members has a history of skin cancer, especially at a young age, talk with your health care provider about genetic screening.  Always use sunscreen. Apply sunscreen liberally and repeatedly throughout the day.  Whenever you are outside, protect yourself by wearing long sleeves, pants, a wide-brimmed hat, and sunglasses.  What should I know about osteoporosis? Osteoporosis is a condition in which bone destruction happens more quickly than new bone creation. After menopause, you may be at an increased risk for osteoporosis. To help prevent osteoporosis or the bone fractures that can happen because of osteoporosis, the following is recommended:  If you are 46-71 years old, get at least 1,000 mg of calcium and at least 600 mg of vitamin D per day.  If you are older than age 55 but younger than age 65, get at least 1,200 mg of calcium and at least 600 mg of vitamin D per day.  If you are older than age 54, get at least 1,200 mg of calcium and at least 800 mg of vitamin D per day.  Smoking and excessive alcohol intake increase the risk of osteoporosis. Eat foods that are rich in calcium and vitamin D, and do weight-bearing exercises several times each week as directed by your health care provider. What should I know about how menopause affects my mental health? Depression may occur at any age, but it is more common as you become older. Common symptoms of depression  include:  Low or sad mood.  Changes in sleep patterns.  Changes in appetite or eating patterns.  Feeling an overall lack of motivation or enjoyment of activities that you previously enjoyed.  Frequent crying spells.  Talk with your health care provider if you think that you are experiencing depression. What should I know about immunizations? It is important that you get and maintain your immunizations. These include:  Tetanus, diphtheria, and pertussis (Tdap) booster vaccine.  Influenza every year before the flu season begins.  Pneumonia vaccine.  Shingles vaccine.  Your health care provider may also recommend other immunizations. This information is not intended to replace advice given to you by your health care provider. Make sure you discuss any questions you have with your health care provider. Document Released: 12/19/2005  Document Revised: 05/16/2016 Document Reviewed: 07/31/2015 Elsevier Interactive Patient Education  2018 [employer]  

## 2018-10-20 NOTE — Progress Notes (Signed)
PT is present today for her annual exam. Pt stated that she has been doing self-breast exams monthly. Pt stated that she is doing well and denies any issues. No problems or concerns.   is doing well no complaints.

## 2018-10-21 LAB — LIPID PANEL
CHOL/HDL RATIO: 3.7 ratio (ref 0.0–4.4)
CHOLESTEROL TOTAL: 220 mg/dL — AB (ref 100–199)
HDL: 60 mg/dL (ref >39)
LDL Calculated: 141 mg/dL — ABNORMAL HIGH (ref 0–99)
Triglycerides: 95 mg/dL (ref 0–149)
VLDL Cholesterol Cal: 19 mg/dL (ref 5–40)

## 2018-10-21 LAB — COMPREHENSIVE METABOLIC PANEL
ALK PHOS: 51 IU/L (ref 39–117)
ALT: 13 IU/L (ref 0–32)
AST: 18 IU/L (ref 0–40)
Albumin/Globulin Ratio: 1.7 (ref 1.2–2.2)
Albumin: 4.8 g/dL (ref 3.5–5.5)
BUN/Creatinine Ratio: 14 (ref 9–23)
BUN: 14 mg/dL (ref 6–24)
Bilirubin Total: 0.5 mg/dL (ref 0.0–1.2)
CO2: 25 mmol/L (ref 20–29)
Calcium: 9.7 mg/dL (ref 8.7–10.2)
Chloride: 101 mmol/L (ref 96–106)
Creatinine, Ser: 1.03 mg/dL — ABNORMAL HIGH (ref 0.57–1.00)
GFR, EST AFRICAN AMERICAN: 71 mL/min/{1.73_m2} (ref >59)
GFR, EST NON AFRICAN AMERICAN: 61 mL/min/{1.73_m2} (ref >59)
GLOBULIN, TOTAL: 2.9 g/dL (ref 1.5–4.5)
Glucose: 87 mg/dL (ref 65–99)
POTASSIUM: 4.8 mmol/L (ref 3.5–5.2)
Sodium: 141 mmol/L (ref 134–144)
Total Protein: 7.7 g/dL (ref 6.0–8.5)

## 2018-10-21 LAB — HIV ANTIBODY (ROUTINE TESTING W REFLEX): HIV Screen 4th Generation wRfx: NONREACTIVE

## 2018-10-21 LAB — TSH: TSH: 5.46 u[IU]/mL — ABNORMAL HIGH (ref 0.450–4.500)

## 2018-10-21 LAB — RPR: RPR Ser Ql: NONREACTIVE

## 2018-10-21 LAB — HEPATITIS C ANTIBODY: Hep C Virus Ab: <0.1 s/co ratio (ref 0.0–0.9)

## 2018-10-21 NOTE — Progress Notes (Signed)
ANNUAL PREVENTATIVE CARE GYNECOLOGY  ENCOUNTER NOTE  Subjective:       Dawn Francis is a 57 y.o. G0P0000 postmenopausal (x 4 years) female here for a routine annual gynecologic exam. The patient is sexually active. The patient has never been taking hormone replacement therapy. Patient denies post-menopausal vaginal bleeding. The patient wears seatbelts: yes. The patient participates in regular exercise: no. Has the patient ever been transfused or tattooed?: no. The patient reports that there is not domestic violence in her life.  Current complaints: 1.  None    Gynecologic History No LMP recorded (lmp unknown). Patient is postmenopausal. Contraception: post menopausal status Last Pap: 11/18/2016. Results were: normal Last mammogram: ~ 2017. Results were: normal Last colonoscopy: 09/2017.  Results were normal.    Obstetric History OB History  Gravida Para Term Preterm AB Living  0 0 0 0 0 0  SAB TAB Ectopic Multiple Live Births  0 0 0 0 0    Past Medical History:  Diagnosis Date  . Fibroid uterus   . History of ovarian cyst   . Seizure disorder ([place])     Family History  Problem Relation Age of Onset  . Hypertension Mother   . Hypertension Father   . Seizures Father     Past Surgical History:  Procedure Laterality Date  . COLONOSCOPY WITH PROPOFOL N/A 09/15/2017   Procedure: COLONOSCOPY WITH PROPOFOL;  Surgeon: Jonathon Bellows, MD;  Location: [facility] ENDOSCOPY;  Service: Gastroenterology;  Laterality: N/A;  . MYOMECTOMY     x 2    Social History   Socioeconomic History  . Marital status: Unknown    Spouse name: Not on file  . Number of children: Not on file  . Years of education: Not on file  . Highest education level: Not on file  Occupational History  . Not on file  Social Needs  . Financial resource strain: Not on file  . Food insecurity:    Worry: Not on file    Inability: Not on file  . Transportation needs:    Medical: Not on file    Non-medical: Not  on file  Tobacco Use  . Smoking status: Never Smoker  . Smokeless tobacco: Never Used  Substance and Sexual Activity  . Alcohol use: Yes    Comment: occass  . Drug use: No  . Sexual activity: Yes    Birth control/protection: Condom  Lifestyle  . Physical activity:    Days per week: Not on file    Minutes per session: Not on file  . Stress: Not on file  Relationships  . Social connections:    Talks on phone: Not on file    Gets together: Not on file    Attends religious service: Not on file    Active member of club or organization: Not on file    Attends meetings of clubs or organizations: Not on file    Relationship status: Not on file  . Intimate partner violence:    Fear of current or ex partner: Not on file    Emotionally abused: Not on file    Physically abused: Not on file    Forced sexual activity: Not on file  Other Topics Concern  . Not on file  Social History Narrative  . Not on file    Current Outpatient Medications on File Prior to Visit  Medication Sig Dispense Refill  . lamoTRIgine (LAMICTAL) 100 MG tablet Take 100 mg by mouth 2 (two) times daily.    Marland Kitchen  linaclotide (LINZESS) 72 MCG capsule Take 1 capsule (72 mcg total) by mouth daily before breakfast. 30 capsule 2   No current facility-administered medications on file prior to visit.     Allergies  Allergen Reactions  . Prednisone   . Ranitidine Hcl Other (See Comments)    Other Reaction: Not Assessed  . Vicodin [Hydrocodone-Acetaminophen]       Review of Systems ROS Review of Systems - General ROS: negative for - chills, fatigue, fever, hot flashes, night sweats, weight gain or weight loss Psychological ROS: negative for - anxiety, decreased libido, depression, mood swings, physical abuse or sexual abuse Ophthalmic ROS: negative for - blurry vision, eye pain or loss of vision ENT ROS: negative for - headaches, hearing change, visual changes or vocal changes Allergy and Immunology ROS: negative  for - hives, itchy/watery eyes or seasonal allergies Hematological and Lymphatic ROS: negative for - bleeding problems, bruising, swollen lymph nodes or weight loss Endocrine ROS: negative for - galactorrhea, hair pattern changes, hot flashes, malaise/lethargy, mood swings, palpitations, polydipsia/polyuria, skin changes, temperature intolerance or unexpected weight changes Breast ROS: negative for - new or changing breast lumps or nipple discharge Respiratory ROS: negative for - cough or shortness of breath Cardiovascular ROS: negative for - chest pain, irregular heartbeat, palpitations or shortness of breath Gastrointestinal ROS: no abdominal pain or black or bloody stools. She does report bowel irregularities, recently started on Linzess for treatment.  Genito-Urinary ROS: no dysuria, trouble voiding, or hematuria Musculoskeletal ROS: negative for - joint pain or joint stiffness Neurological ROS: negative for - bowel and bladder control changes Dermatological ROS: negative for rash and skin lesion changes   Objective:   BP 130/75   Pulse 77   Ht 5\' 5"  (1.651 m)   Wt 143 lb 1.6 oz (64.9 kg)   LMP  (LMP Unknown) Comment: over 1 year ago  BMI 23.81 kg/m  CONSTITUTIONAL: Well-developed, well-nourished female in no acute distress.  PSYCHIATRIC: Normal mood and affect. Normal behavior. Normal judgment and thought content. [place]: Alert and oriented to person, place, and time. Normal muscle tone coordination. No cranial nerve deficit noted. HENT:  Normocephalic, atraumatic, External right and left ear normal. Oropharynx is clear and moist EYES: Conjunctivae and EOM are normal. Pupils are equal, round, and reactive to light. No scleral icterus.  NECK: Normal range of motion, supple, no masses.  Normal thyroid.  SKIN: Skin is warm and dry. No rash noted. Not diaphoretic. No erythema. No pallor. CARDIOVASCULAR: Normal heart rate noted, regular rhythm, no murmur. RESPIRATORY: Clear to  auscultation bilaterally. Effort and breath sounds normal, no problems with respiration noted. BREASTS: Symmetric in size. No masses, skin changes, nipple drainage, or lymphadenopathy. ABDOMEN: Soft, normal bowel sounds, no distention noted.  No tenderness, rebound or guarding.  BLADDER: Normal PELVIC:  Bladder no bladder distension noted  Urethra: normal appearing urethra with no masses, tenderness or lesions  Vulva: normal appearing vulva with no masses, tenderness or lesions  Vagina: normal appearing vagina with normal color and discharge, no lesions  Cervix: normal appearing cervix without discharge or lesions and cervical stenosis present  Uterus: uterus is normal size, shape, consistency and nontender  Adnexa: normal adnexa in size, nontender and no masses  RV: External Exam NormaI, No Rectal Masses and Normal Sphincter tone  MUSCULOSKELETAL: Normal range of motion. No tenderness.  No cyanosis, clubbing, or edema.  2+ distal pulses. LYMPHATIC: No Axillary, Supraclavicular, or Inguinal Adenopathy.   Labs:  Results for AHNESTY, FINFROCK (MRN [id number])  as of 10/21/2018 22:10  Ref. Range 01/16/2018 08:03  WBC Latest Ref Range: 3.6 - 11.0 K/uL 4.0  RBC Latest Ref Range: 3.80 - 5.20 MIL/uL 4.52  Hemoglobin Latest Ref Range: 12.0 - 16.0 g/dL 13.8  HCT Latest Ref Range: 35.0 - 47.0 % 40.6  MCV Latest Ref Range: 80.0 - 100.0 fL 89.9  MCH Latest Ref Range: 26.0 - 34.0 pg 30.5  MCHC Latest Ref Range: 32.0 - 36.0 g/dL 34.0  RDW Latest Ref Range: 11.5 - 14.5 % 12.8  Platelets Latest Ref Range: 150 - 440 K/uL 279   Results for GENIFER, LAZENBY (MRN [id number]) as of 10/21/2018 22:10  Ref. Range 01/16/2018 08:03  COMPREHENSIVE METABOLIC PANEL Unknown Rpt (A)  Sodium Latest Ref Range: 135 - 145 mmol/L 139  Potassium Latest Ref Range: 3.5 - 5.1 mmol/L 4.0  Chloride Latest Ref Range: 101 - 111 mmol/L 104  CO2 Latest Ref Range: 22 - 32 mmol/L 28  Glucose Latest Ref Range: 65 - 99 mg/dL 101  (H)  BUN Latest Ref Range: 6 - 20 mg/dL 17  Creatinine Latest Ref Range: 0.44 - 1.00 mg/dL 1.09 (H)  Calcium Latest Ref Range: 8.9 - 10.3 mg/dL 9.4  Anion gap Latest Ref Range: 5 - 15  7  Alkaline Phosphatase Latest Ref Range: 38 - 126 U/L 41  Albumin Latest Ref Range: 3.5 - 5.0 g/dL 4.5  Lipase Latest Ref Range: 11 - 51 U/L 28  AST Latest Ref Range: 15 - 41 U/L 21  ALT Latest Ref Range: 14 - 54 U/L 17  Total Protein Latest Ref Range: 6.5 - 8.1 g/dL 7.6  Total Bilirubin Latest Ref Range: 0.3 - 1.2 mg/dL 0.9  GFR, Est Non African American Latest Ref Range: >60 mL/min 56 (L)  GFR, Est African American Latest Ref Range: >60 mL/min >60    No results found for: HGBA1C   Assessment:   Annual gynecologic examination 57 y.o. Contraception: post menopausal status Normal BMI  STD screening  Plan:  Pap: Pap Co Test. Discussed that pap smear screenings are now recommended q 3 years.  Patient still desires screen today. Will perform.  Mammogram: Ordered Stool Guaiac Testing:  Not Indicated.  Patient is up to date on colonoscopy.  Labs: CMP, TSH, lipid panel Routine preventative health maintenance measures emphasized: Exercise/Diet/Weight control, Alcohol/Substance use risks and Stress Management  Patient desires STD screening. Denies recent exposure.  Will order per patient request.  Return to [place], MD [facility] Care

## 2018-10-22 LAB — CYTOLOGY - PAP
ADEQUACY: ABSENT
CHLAMYDIA, DNA PROBE: NEGATIVE
DIAGNOSIS: NEGATIVE
HPV (WINDOPATH): NOT DETECTED
NEISSERIA GONORRHEA: NEGATIVE

## 2019-05-24 DIAGNOSIS — G4700 Insomnia, unspecified: Secondary | ICD-10-CM | POA: Diagnosis not present

## 2019-05-24 DIAGNOSIS — G4733 Obstructive sleep apnea (adult) (pediatric): Secondary | ICD-10-CM | POA: Diagnosis not present

## 2019-05-24 DIAGNOSIS — R569 Unspecified convulsions: Secondary | ICD-10-CM | POA: Diagnosis not present

## 2019-10-24 ENCOUNTER — Encounter: Payer: BLUE CROSS/BLUE SHIELD | Admitting: Obstetrics and Gynecology

## 2019-12-08 DIAGNOSIS — Z23 Encounter for immunization: Secondary | ICD-10-CM | POA: Diagnosis not present

## 2019-12-08 DIAGNOSIS — Z136 Encounter for screening for cardiovascular disorders: Secondary | ICD-10-CM | POA: Diagnosis not present

## 2019-12-08 DIAGNOSIS — R030 Elevated blood-pressure reading, without diagnosis of hypertension: Secondary | ICD-10-CM | POA: Diagnosis not present

## 2019-12-08 DIAGNOSIS — Z13220 Encounter for screening for lipoid disorders: Secondary | ICD-10-CM | POA: Diagnosis not present

## 2019-12-08 DIAGNOSIS — Z713 Dietary counseling and surveillance: Secondary | ICD-10-CM | POA: Diagnosis not present

## 2019-12-08 DIAGNOSIS — Z6825 Body mass index (BMI) 25.0-25.9, adult: Secondary | ICD-10-CM | POA: Diagnosis not present

## 2019-12-08 DIAGNOSIS — Z131 Encounter for screening for diabetes mellitus: Secondary | ICD-10-CM | POA: Diagnosis not present

## 2020-03-02 ENCOUNTER — Encounter: Payer: Self-pay | Admitting: Obstetrics and Gynecology

## 2020-03-14 ENCOUNTER — Encounter: Payer: Self-pay | Admitting: Obstetrics and Gynecology

## 2020-03-14 ENCOUNTER — Other Ambulatory Visit (HOSPITAL_COMMUNITY)
Admission: RE | Admit: 2020-03-14 | Discharge: 2020-03-14 | Disposition: A | Discharge status: Home / Self Care | Payer: BC Managed Care – PPO | Source: Ambulatory Visit | Attending: Obstetrics and Gynecology | Admitting: Obstetrics and Gynecology

## 2020-03-14 ENCOUNTER — Ambulatory Visit (INDEPENDENT_AMBULATORY_CARE_PROVIDER_SITE_OTHER): Payer: BC Managed Care – PPO | Admitting: Obstetrics and Gynecology

## 2020-03-14 ENCOUNTER — Other Ambulatory Visit: Payer: Self-pay

## 2020-03-14 VITALS — BP 114/73 | HR 80 | Ht 65.0 in | Wt 153.5 lb

## 2020-03-14 DIAGNOSIS — Z1231 Encounter for screening mammogram for malignant neoplasm of breast: Secondary | ICD-10-CM | POA: Diagnosis not present

## 2020-03-14 DIAGNOSIS — R140 Abdominal distension (gaseous): Secondary | ICD-10-CM

## 2020-03-14 DIAGNOSIS — Z01419 Encounter for gynecological examination (general) (routine) without abnormal findings: Secondary | ICD-10-CM

## 2020-03-14 DIAGNOSIS — K5904 Chronic idiopathic constipation: Secondary | ICD-10-CM | POA: Diagnosis not present

## 2020-03-14 MED ORDER — LINACLOTIDE 72 MCG PO CAPS: 72.0000 ug | ORAL_CAPSULE | Freq: Every day | ORAL | 2 refills | Status: DC

## 2020-03-14 NOTE — Progress Notes (Signed)
ANNUAL PREVENTATIVE CARE GYNECOLOGY  ENCOUNTER NOTE  Subjective:       Dawn Francis is a 59 y.o. G0P0000 postmenopausal female here for a routine annual gynecologic exam. The patient is sexually active. The patient has never taken hormone replacement therapy. Patient denies post-menopausal vaginal bleeding. The patient wears seatbelts: yes. The patient participates in regular exercise: no. Has the patient ever been transfused or tattooed?: no. The patient reports that there is not domestic violence in her life. Denies concern for STDs.   Current complaints: 1. Constipation/Bloating. Pt reports feeling bloated and having chronic issues with constipation. She states currently she is having a bowel movement approximately every other day, but she feels bloated constantly.  She was previously on Linzess with improvement, but has run out and requests a refill.    Gynecologic History No LMP recorded (lmp unknown). Patient is postmenopausal. Contraception: post menopausal status Last Pap: 11/18/2017. Results were: normal Last mammogram: ~ 2017. Results were: normal Last colonoscopy: 09/2017.  Results were normal.   Obstetric History OB History  Gravida Para Term Preterm AB Living  1 0 0 0 1 0  SAB TAB Ectopic Multiple Live Births  0 0 1 0 0    # Outcome Date GA Lbr Len/2nd Weight Sex Delivery Anes PTL Lv  1 Ectopic 1986            Past Medical History:  Diagnosis Date  . Fibroid uterus   . History of ovarian cyst   . Seizure disorder ([place])     Family History  Problem Relation Age of Onset  . Hypertension Mother   . Hypertension Father   . Seizures Father     Past Surgical History:  Procedure Laterality Date  . COLONOSCOPY WITH PROPOFOL N/A 09/15/2017   Procedure: COLONOSCOPY WITH PROPOFOL;  Surgeon: Jonathon Bellows, MD;  Location: [facility] ENDOSCOPY;  Service: Gastroenterology;  Laterality: N/A;  . MYOMECTOMY     x 2    Social History   Socioeconomic History  . Marital  status: Unknown    Spouse name: Not on file  . Number of children: Not on file  . Years of education: Not on file  . Highest education level: Not on file  Occupational History  . Not on file  Tobacco Use  . Smoking status: Never Smoker  . Smokeless tobacco: Never Used  Substance and Sexual Activity  . Alcohol use: Yes    Comment: occass  . Drug use: No  . Sexual activity: Not Currently    Birth control/protection: Condom  Other Topics Concern  . Not on file  Social History Narrative  . Not on file   Social Determinants of Health   Financial Resource Strain:   . Difficulty of Paying Living Expenses:   Food Insecurity:   . Worried About Charity fundraiser in the Last Year:   . Arboriculturist in the Last Year:   Transportation Needs:   . Film/video editor (Medical):   Marland Kitchen Lack of Transportation (Non-Medical):   Physical Activity:   . Days of Exercise per Week:   . Minutes of Exercise per Session:   Stress:   . Feeling of Stress :   Social Connections:   . Frequency of Communication with Friends and Family:   . Frequency of Social Gatherings with Friends and Family:   . Attends Religious Services:   . Active Member of Clubs or Organizations:   . Attends Archivist Meetings:   .  Marital Status:   Intimate Partner Violence:   . Fear of Current or Ex-Partner:   . Emotionally Abused:   Marland Kitchen Physically Abused:   . Sexually Abused:     Current Outpatient Medications on File Prior to Visit  Medication Sig Dispense Refill  . lamoTRIgine (LAMICTAL) 100 MG tablet Take 100 mg by mouth 2 (two) times daily.    Marland Kitchen linaclotide (LINZESS) 72 MCG capsule Take 1 capsule (72 mcg total) by mouth daily before breakfast. (Patient not taking: Reported on 03/14/2020) 30 capsule 2   No current facility-administered medications on file prior to visit.    Allergies  Allergen Reactions  . Prednisone   . Ranitidine Hcl Other (See Comments)    Other Reaction: Not Assessed  .  Vicodin [Hydrocodone-Acetaminophen]     Review of Systems ROS Review of Systems - General ROS: negative for - chills, fatigue, fever, hot flashes, night sweats, weight gain or weight loss Psychological ROS: negative for - anxiety, decreased libido, depression, mood swings, physical abuse or sexual abuse Ophthalmic ROS: negative for - blurry vision, eye pain or loss of vision ENT ROS: negative for - headaches, hearing change, visual changes or vocal changes Allergy and Immunology ROS: negative for - hives, itchy/watery eyes or seasonal allergies Hematological and Lymphatic ROS: negative for - bleeding problems, bruising, swollen lymph nodes or weight loss Endocrine ROS: negative for - galactorrhea, hair pattern changes, hot flashes, malaise/lethargy, mood swings, palpitations, polydipsia/polyuria, skin changes, temperature intolerance or unexpected weight changes Breast ROS: negative for - new or changing breast lumps or nipple discharge Respiratory ROS: negative for - cough or shortness of breath Cardiovascular ROS: negative for - chest pain, irregular heartbeat, palpitations or shortness of breath Gastrointestinal ROS: no abdominal pain or black or bloody stools. She does report bowel irregularities/bloating, previously taking Linzess, but currently out of medication. Genito-Urinary ROS: no dysuria, trouble voiding, or hematuria Musculoskeletal ROS: negative for - joint pain or joint stiffness Neurological ROS: negative for - bowel and bladder control changes Dermatological ROS: negative for rash and skin lesion changes   Objective:   BP 114/73   Pulse 80   Ht 5\' 5"  (1.651 m)   Wt 153 lb 8 oz (69.6 kg)   LMP  (LMP Unknown) Comment: over 1 year ago  BMI 25.54 kg/m  CONSTITUTIONAL: Well-developed, well-nourished female in no acute distress.  PSYCHIATRIC: Normal mood and affect. Normal behavior. Normal judgment and thought content. [place]: Alert and oriented to person, place, and  time. Normal muscle tone coordination. No cranial nerve deficit noted. HENT:  Normocephalic, atraumatic, External right and left ear normal. Oropharynx is clear and moist EYES: Conjunctivae and EOM are normal. Pupils are equal, round, and reactive to light. No scleral icterus.  NECK: Normal range of motion, supple, no masses.  Normal thyroid.  SKIN: Skin is warm and dry. No rash noted. Not diaphoretic. No erythema. No pallor. CARDIOVASCULAR: Normal heart rate noted, regular rhythm, no murmur. RESPIRATORY: Clear to auscultation bilaterally. Effort and breath sounds normal, no problems with respiration noted. BREASTS: Symmetric in size. No masses, skin changes, nipple drainage, or lymphadenopathy. ABDOMEN: Soft, normal bowel sounds, no distention noted.  No tenderness, rebound or guarding.  BLADDER: Normal PELVIC:  Bladder no bladder distension noted  Urethra: normal appearing urethra with no masses, tenderness or lesions  Vulva: normal appearing vulva with no masses, tenderness or lesions  Vagina: normal appearing vagina with normal color and discharge, no lesions  Cervix: normal appearing cervix without discharge or lesions  and cervical stenosis present  Uterus: uterus is normal size, shape, consistency and nontender  Adnexa: normal adnexa in size, nontender and no masses  RV: External Exam NormaI, No Rectal Masses and Normal Sphincter tone MUSCULOSKELETAL: Normal range of motion. No tenderness.  No cyanosis, clubbing, or edema.  2+ distal pulses. LYMPHATIC: No Axillary, Supraclavicular, or Inguinal Adenopathy.   Labs:  Refer to Care Everywhere for lipid panel, fasting blood glucose, and A1C from 11/2019.   Assessment:   Annual gynecologic examination 59 y.o. Contraception: post menopausal status Normal BMI  Bloating/Constipation  Plan:  Pap: Pap Co Test performed today.  Mammogram: Ordered, scheduled.  Stool Guaiac Testing:  Not Indicated.  Patient is up to date on colonoscopy.   Labs: CMP, CBC, TSH ordered. Patient to return for fasting labs.  Routine preventative health maintenance measures emphasized: Exercise/Diet/Weight control, Alcohol/Substance use risks and Stress Management  Refilled Linzess for bloating/constipation. Discussed increasing fiber intake and fluid intake.  Return to [facility] or sooner as needed.    Rubie Maid, MD [facility]

## 2020-03-14 NOTE — Patient Instructions (Signed)
Health Maintenance for Postmenopausal Women Menopause is a normal process in which your ability to get pregnant comes to an end. This process happens slowly over many months or years, usually between the ages of 48 and 55. Menopause is complete when you have missed your menstrual periods for 12 months. It is important to talk with your health care provider about some of the most common conditions that affect women after menopause (postmenopausal women). These include heart disease, cancer, and bone loss (osteoporosis). Adopting a healthy lifestyle and getting preventive care can help to promote your health and wellness. The actions you take can also lower your chances of developing some of these common conditions. What should I know about menopause? During menopause, you may get a number of symptoms, such as:  Hot flashes. These can be moderate or severe.  Night sweats.  Decrease in sex drive.  Mood swings.  Headaches.  Tiredness.  Irritability.  Memory problems.  Insomnia. Choosing to treat or not to treat these symptoms is a decision that you make with your health care provider. Do I need hormone replacement therapy?  Hormone replacement therapy is effective in treating symptoms that are caused by menopause, such as hot flashes and night sweats.  Hormone replacement carries certain risks, especially as you become older. If you are thinking about using estrogen or estrogen with progestin, discuss the benefits and risks with your health care provider. What is my risk for heart disease and stroke? The risk of heart disease, heart attack, and stroke increases as you age. One of the causes may be a change in the body's hormones during menopause. This can affect how your body uses dietary fats, triglycerides, and cholesterol. Heart attack and stroke are medical emergencies. There are many things that you can do to help prevent heart disease and stroke. Watch your blood pressure  High  blood pressure causes heart disease and increases the risk of stroke. This is more likely to develop in people who have high blood pressure readings, are of African descent, or are overweight.  Have your blood pressure checked: ? Every 3-5 years if you are 18-39 years of age. ? Every year if you are 40 years old or older. Eat a healthy diet   Eat a diet that includes plenty of vegetables, fruits, low-fat dairy products, and lean protein.  Do not eat a lot of foods that are high in solid fats, added sugars, or sodium. Get regular exercise Get regular exercise. This is one of the most important things you can do for your health. Most adults should:  Try to exercise for at least 150 minutes each week. The exercise should increase your heart rate and make you sweat (moderate-intensity exercise).  Try to do strengthening exercises at least twice each week. Do these in addition to the moderate-intensity exercise.  Spend less time sitting. Even light physical activity can be beneficial. Other tips  Work with your health care provider to achieve or maintain a healthy weight.  Do not use any products that contain nicotine or tobacco, such as cigarettes, e-cigarettes, and chewing tobacco. If you need help quitting, ask your health care provider.  Know your numbers. Ask your health care provider to check your cholesterol and your blood sugar (glucose). Continue to have your blood tested as directed by your health care provider. Do I need screening for cancer? Depending on your health history and family history, you may need to have cancer screening at different stages of your life. This   may include screening for:  Breast cancer.  Cervical cancer.  Lung cancer.  Colorectal cancer. What is my risk for osteoporosis? After menopause, you may be at increased risk for osteoporosis. Osteoporosis is a condition in which bone destruction happens more quickly than new bone creation. To help prevent  osteoporosis or the bone fractures that can happen because of osteoporosis, you may take the following actions:  If you are 19-50 years old, get at least 1,000 mg of calcium and at least 600 mg of vitamin D per day.  If you are older than age 50 but younger than age 70, get at least 1,200 mg of calcium and at least 600 mg of vitamin D per day.  If you are older than age 70, get at least 1,200 mg of calcium and at least 800 mg of vitamin D per day. Smoking and drinking excessive alcohol increase the risk of osteoporosis. Eat foods that are rich in calcium and vitamin D, and do weight-bearing exercises several times each week as directed by your health care provider. How does menopause affect my mental health? Depression may occur at any age, but it is more common as you become older. Common symptoms of depression include:  Low or sad mood.  Changes in sleep patterns.  Changes in appetite or eating patterns.  Feeling an overall lack of motivation or enjoyment of activities that you previously enjoyed.  Frequent crying spells. Talk with your health care provider if you think that you are experiencing depression. General instructions See your health care provider for regular wellness exams and vaccines. This may include:  Scheduling regular health, dental, and eye exams.  Getting and maintaining your vaccines. These include: ? Influenza vaccine. Get this vaccine each year before the flu season begins. ? Pneumonia vaccine. ? Shingles vaccine. ? Tetanus, diphtheria, and pertussis (Tdap) booster vaccine. Your health care provider may also recommend other immunizations. Tell your health care provider if you have ever been abused or do not feel safe at home. Summary  Menopause is a normal process in which your ability to get pregnant comes to an end.  This condition causes hot flashes, night sweats, decreased interest in sex, mood swings, headaches, or lack of sleep.  Treatment for this  condition may include hormone replacement therapy.  Take actions to keep yourself healthy, including exercising regularly, eating a healthy diet, watching your weight, and checking your blood pressure and blood sugar levels.  Get screened for cancer and depression. Make sure that you are up to date with all your vaccines. This information is not intended to replace advice given to you by your health care provider. Make sure you discuss any questions you have with your health care provider. Document Revised: 10/20/2018 Document Reviewed: 10/20/2018 Elsevier Patient Education  2020 Elsevier Inc.    Breast Self-Awareness Breast self-awareness means being familiar with how your breasts look and feel. It involves checking your breasts regularly and reporting any changes to your health care provider. Practicing breast self-awareness is important. Sometimes changes may not be harmful (are benign), but sometimes a change in your breasts can be a sign of a serious medical problem. It is important to learn how to do this procedure correctly so that you can catch problems early, when treatment is more likely to be successful. All women should practice breast self-awareness, including women who have had breast implants. What you need:  A mirror.  A well-lit room. How to do a breast self-exam A breast self-exam is   one way to learn what is normal for your breasts and whether your breasts are changing. To do a breast self-exam: Look for changes  1. Remove all the clothing above your waist. 2. Stand in front of a mirror in a room with good lighting. 3. Put your hands on your hips. 4. Push your hands firmly downward. 5. Compare your breasts in the mirror. Look for differences between them (asymmetry), such as: ? Differences in shape. ? Differences in size. ? Puckers, dips, and bumps in one breast and not the other. 6. Look at each breast for changes in the skin, such as: ? Redness. ? Scaly  areas. 7. Look for changes in your nipples, such as: ? Discharge. ? Bleeding. ? Dimpling. ? Redness. ? A change in position. Feel for changes Carefully feel your breasts for lumps and changes. It is best to do this while lying on your back on the floor, and again while sitting or standing in the tub or shower with soapy water on your skin. Feel each breast in the following way: 1. Place the arm on the side of the breast you are examining above your head. 2. Feel your breast with the other hand. 3. Start in the nipple area and make -inch (2 cm) overlapping circles to feel your breast. Use the pads of your three middle fingers to do this. Apply light pressure, then medium pressure, then firm pressure. The light pressure will allow you to feel the tissue closest to the skin. The medium pressure will allow you to feel the tissue that is a little deeper. The firm pressure will allow you to feel the tissue close to the ribs. 4. Continue the overlapping circles, moving downward over the breast until you feel your ribs below your breast. 5. Move one finger-width toward the center of the body. Continue to use the -inch (2 cm) overlapping circles to feel your breast as you move slowly up toward your collarbone. 6. Continue the up-and-down exam using all three pressures until you reach your armpit.  Write down what you find Writing down what you find can help you remember what to discuss with your health care provider. Write down:  What is normal for each breast.  Any changes that you find in each breast, including: ? The kind of changes you find. ? Any pain or tenderness. ? Size and location of any lumps.  Where you are in your menstrual cycle, if you are still menstruating. General tips and recommendations  Examine your breasts every month.  If you are breastfeeding, the best time to examine your breasts is after a feeding or after using a breast pump.  If you menstruate, the best time to  examine your breasts is 5-7 days after your period. Breasts are generally lumpier during menstrual periods, and it may be more difficult to notice changes.  With time and practice, you will become more familiar with the variations in your breasts and more comfortable with the exam. Contact a health care provider if you:  See a change in the shape or size of your breasts or nipples.  See a change in the skin of your breast or nipples, such as a reddened or scaly area.  Have unusual discharge from your nipples.  Find a lump or thick area that was not there before.  Have pain in your breasts.  Have any concerns related to your breast health. Summary  Breast self-awareness includes looking for physical changes in your breasts, as well   as feeling for any changes within your breasts.  Breast self-awareness should be performed in front of a mirror in a well-lit room.  You should examine your breasts every month. If you menstruate, the best time to examine your breasts is 5-7 days after your menstrual period.  Let your health care provider know of any changes you notice in your breasts, including changes in size, changes on the skin, pain or tenderness, or unusual fluid from your nipples. This information is not intended to replace advice given to you by your health care provider. Make sure you discuss any questions you have with your health care provider. Document Revised: 06/15/2018 Document Reviewed: 06/15/2018 Elsevier Patient Education  2020 Elsevier Inc.  

## 2020-03-14 NOTE — Progress Notes (Signed)
Pt present for annual exam. Pt stated that she has been really tired after work due to the type of work she does. Pt stated that she lifts, push, pull heavy items the entire shift.

## 2020-03-20 LAB — CYTOLOGY - PAP
Adequacy: ABSENT
Comment: NEGATIVE
Diagnosis: NEGATIVE
High risk HPV: NEGATIVE

## 2020-04-04 ENCOUNTER — Other Ambulatory Visit: Payer: Self-pay

## 2020-04-04 ENCOUNTER — Other Ambulatory Visit: Payer: BC Managed Care – PPO

## 2020-04-04 DIAGNOSIS — Z01419 Encounter for gynecological examination (general) (routine) without abnormal findings: Secondary | ICD-10-CM | POA: Diagnosis not present

## 2020-04-05 LAB — CBC
Hematocrit: 39.4 % (ref 34.0–46.6)
Hemoglobin: 13.2 g/dL (ref 11.1–15.9)
MCH: 30.3 pg (ref 26.6–33.0)
MCHC: 33.5 g/dL (ref 31.5–35.7)
MCV: 90 fL (ref 79–97)
Platelets: 247 10*3/uL (ref 150–450)
RBC: 4.36 x10E6/uL (ref 3.77–5.28)
RDW: 12.5 % (ref 11.7–15.4)
WBC: 4 10*3/uL (ref 3.4–10.8)

## 2020-04-05 LAB — TSH: TSH: 4.37 u[IU]/mL (ref 0.450–4.500)

## 2020-04-10 ENCOUNTER — Ambulatory Visit
Admission: RE | Admit: 2020-04-10 | Discharge: 2020-04-10 | Disposition: A | Discharge status: Home / Self Care | Payer: BC Managed Care – PPO | Source: Ambulatory Visit | Attending: Obstetrics and Gynecology | Admitting: Obstetrics and Gynecology

## 2020-04-10 DIAGNOSIS — Z1231 Encounter for screening mammogram for malignant neoplasm of breast: Secondary | ICD-10-CM | POA: Insufficient documentation

## 2020-09-10 DIAGNOSIS — Z79899 Other long term (current) drug therapy: Secondary | ICD-10-CM | POA: Diagnosis not present

## 2020-09-10 DIAGNOSIS — R569 Unspecified convulsions: Secondary | ICD-10-CM | POA: Diagnosis not present

## 2020-09-10 DIAGNOSIS — Z20828 Contact with and (suspected) exposure to other viral communicable diseases: Secondary | ICD-10-CM | POA: Diagnosis not present

## 2020-09-10 DIAGNOSIS — G4733 Obstructive sleep apnea (adult) (pediatric): Secondary | ICD-10-CM | POA: Diagnosis not present

## 2020-09-20 DIAGNOSIS — Z79899 Other long term (current) drug therapy: Secondary | ICD-10-CM | POA: Diagnosis not present

## 2020-09-20 DIAGNOSIS — R569 Unspecified convulsions: Secondary | ICD-10-CM | POA: Diagnosis not present

## 2020-09-20 DIAGNOSIS — Z20828 Contact with and (suspected) exposure to other viral communicable diseases: Secondary | ICD-10-CM | POA: Diagnosis not present

## 2020-09-20 DIAGNOSIS — E559 Vitamin D deficiency, unspecified: Secondary | ICD-10-CM | POA: Diagnosis not present

## 2020-11-14 DIAGNOSIS — Z03818 Encounter for observation for suspected exposure to other biological agents ruled out: Secondary | ICD-10-CM | POA: Diagnosis not present

## 2020-11-14 DIAGNOSIS — U071 COVID-19: Secondary | ICD-10-CM | POA: Diagnosis not present

## 2020-11-14 DIAGNOSIS — Z20822 Contact with and (suspected) exposure to covid-19: Secondary | ICD-10-CM | POA: Diagnosis not present

## 2021-03-15 NOTE — Patient Instructions (Incomplete)
Preventive Care 84-58 Years Old, Female Preventive care refers to lifestyle choices and visits with your health care provider that can promote health and wellness. This includes:  A yearly physical exam. This is also called an annual wellness visit.  Regular dental and eye exams.  Immunizations.  Screening for certain conditions.  Healthy lifestyle choices, such as: ? Eating a healthy diet. ? Getting regular exercise. ? Not using drugs or products that contain nicotine and tobacco. ? Limiting alcohol use. What can I expect for my preventive care visit? Physical exam Your health care provider will check your:  Height and weight. These may be used to calculate your BMI (body mass index). BMI is a measurement that tells if you are at a healthy weight.  Heart rate and blood pressure.  Body temperature.  Skin for abnormal spots. Counseling Your health care provider may ask you questions about your:  Past medical problems.  Family's medical history.  Alcohol, tobacco, and drug use.  Emotional well-being.  Home life and relationship well-being.  Sexual activity.  Diet, exercise, and sleep habits.  Work and work Statistician.  Access to firearms.  Method of birth control.  Menstrual cycle.  Pregnancy history. What immunizations do I need? Vaccines are usually given at various ages, according to a schedule. Your health care provider will recommend vaccines for you based on your age, medical history, and lifestyle or other factors, such as travel or where you work.   What tests do I need? Blood tests  Lipid and cholesterol levels. These may be checked every 5 years, or more often if you are over 58 years old.  Hepatitis C test.  Hepatitis B test. Screening  Lung cancer screening. You may have this screening every year starting at age 58 if you have a 30-pack-year history of smoking and currently smoke or have quit within the past 15 years.  Colorectal cancer  screening. ? All adults should have this screening starting at age 58 and continuing until age 17. ? Your health care provider may recommend screening at age 58 if you are at increased risk. ? You will have tests every 1-10 years, depending on your results and the type of screening test.  Diabetes screening. ? This is done by checking your blood sugar (glucose) after you have not eaten for a while (fasting). ? You may have this done every 1-3 years.  Mammogram. ? This may be done every 1-2 years. ? Talk with your health care provider about when you should start having regular mammograms. This may depend on whether you have a family history of breast cancer.  BRCA-related cancer screening. This may be done if you have a family history of breast, ovarian, tubal, or peritoneal cancers.  Pelvic exam and Pap test. ? This may be done every 3 years starting at age 10. ? Starting at age 58, this may be done every 5 years if you have a Pap test in combination with an HPV test. Other tests  STD (sexually transmitted disease) testing, if you are at risk.  Bone density scan. This is done to screen for osteoporosis. You may have this scan if you are at high risk for osteoporosis. Talk with your health care provider about your test results, treatment options, and if necessary, the need for more tests. Follow these instructions at home: Eating and drinking  Eat a diet that includes fresh fruits and vegetables, whole grains, lean protein, and low-fat dairy products.  Take vitamin and mineral supplements  as recommended by your health care provider.  Do not drink alcohol if: ? Your health care provider tells you not to drink. ? You are pregnant, may be pregnant, or are planning to become pregnant.  If you drink alcohol: ? Limit how much you have to 0-1 drink a day. ? Be aware of how much alcohol is in your drink. In the [place] one drink equals one 12 oz bottle of beer (355 mL), one 5 oz glass of  wine (148 mL), or one 1 oz glass of hard liquor (44 mL).   Lifestyle  Take daily care of your teeth and gums. Brush your teeth every morning and night with fluoride toothpaste. Floss one time each day.  Stay active. Exercise for at least 30 minutes 5 or more days each week.  Do not use any products that contain nicotine or tobacco, such as cigarettes, e-cigarettes, and chewing tobacco. If you need help quitting, ask your health care provider.  Do not use drugs.  If you are sexually active, practice safe sex. Use a condom or other form of protection to prevent STIs (sexually transmitted infections).  If you do not wish to become pregnant, use a form of birth control. If you plan to become pregnant, see your health care provider for a prepregnancy visit.  If told by your health care provider, take low-dose aspirin daily starting at age 75.  Find healthy ways to cope with stress, such as: ? Meditation, yoga, or listening to music. ? Journaling. ? Talking to a trusted person. ? Spending time with friends and family. Safety  Always wear your seat belt while driving or riding in a vehicle.  Do not drive: ? If you have been drinking alcohol. Do not ride with someone who has been drinking. ? When you are tired or distracted. ? While texting.  Wear a helmet and other protective equipment during sports activities.  If you have firearms in your house, make sure you follow all gun safety procedures. What's next?  Visit your health care provider once a year for an annual wellness visit.  Ask your health care provider how often you should have your eyes and teeth checked.  Stay up to date on all vaccines. This information is not intended to replace advice given to you by your health care provider. Make sure you discuss any questions you have with your health care provider. Document Revised: 07/31/2020 Document Reviewed: 07/08/2018 Elsevier Patient Education  2021 [place] Breast self-awareness is knowing how your breasts look and feel. Doing breast self-awareness is important. It allows you to catch a breast problem early while it is still small and can be treated. All women should do breast self-awareness, including women who have had breast implants. Tell your doctor if you notice a change in your breasts. What you need:  A mirror.  A well-lit room. How to do a breast self-exam A breast self-exam is one way to learn what is normal for your breasts and to check for changes. To do a breast self-exam: Look for changes 1. Take off all the clothes above your waist. 2. Stand in front of a mirror in a room with good lighting. 3. Put your hands on your hips. 4. Push your hands down. 5. Look at your breasts and nipples in the mirror to see if one breast or nipple looks different from the other. Check to see if: ? The shape of one breast is different. ? The size of  one breast is different. ? There are wrinkles, dips, and bumps in one breast and not the other. 6. Look at each breast for changes in the skin, such as: ? Redness. ? Scaly areas. 7. Look for changes in your nipples, such as: ? Liquid around the nipples. ? Bleeding. ? Dimpling. ? Redness. ? A change in where the nipples are.   Feel for changes 1. Lie on your back on the floor. 2. Feel each breast. To do this, follow these steps: ? Pick a breast to feel. ? Put the arm closest to that breast above your head. ? Use your other arm to feel the nipple area of your breast. Feel the area with the pads of your three middle fingers by making small circles with your fingers. For the first circle, press lightly. For the second circle, press harder. For the third circle, press even harder. ? Keep making circles with your fingers at the different pressures as you move down your breast. Stop when you feel your ribs. ? Move your fingers a little toward the center of your body. ? Start making  circles with your fingers again, this time going up until you reach your collarbone. ? Keep making up-and-down circles until you reach your armpit. Remember to keep using the three pressures. ? Feel the other breast in the same way. 3. Sit or stand in the tub or shower. 4. With soapy water on your skin, feel each breast the same way you did in step 2 when you were lying on the floor.   Write down what you find Writing down what you find can help you remember what to tell your doctor. Write down:  What is normal for each breast.  Any changes you find in each breast, including: ? The kind of changes you find. ? Whether you have pain. ? Size and location of any lumps.  When you last had your menstrual period. General tips  Check your breasts every month.  If you are breastfeeding, the best time to check your breasts is after you feed your baby or after you use a breast pump.  If you get menstrual periods, the best time to check your breasts is 5-7 days after your menstrual period is over.  With time, you will become comfortable with the self-exam, and you will begin to know if there are changes in your breasts. Contact a doctor if you:  See a change in the shape or size of your breasts or nipples.  See a change in the skin of your breast or nipples, such as red or scaly skin.  Have fluid coming from your nipples that is not normal.  Find a lump or thick area that was not there before.  Have pain in your breasts.  Have any concerns about your breast health. Summary  Breast self-awareness includes looking for changes in your breasts, as well as feeling for changes within your breasts.  Breast self-awareness should be done in front of a mirror in a well-lit room.  You should check your breasts every month. If you get menstrual periods, the best time to check your breasts is 5-7 days after your menstrual period is over.  Let your doctor know of any changes you see in your  breasts, including changes in size, changes on the skin, pain or tenderness, or fluid from your nipples that is not normal. This information is not intended to replace advice given to you by your health care provider. Make sure  you discuss any questions you have with your health care provider. Document Revised: 06/15/2018 Document Reviewed: 06/15/2018 Elsevier Patient Education  [place].

## 2021-03-19 ENCOUNTER — Encounter: Payer: BC Managed Care – PPO | Admitting: Obstetrics and Gynecology

## 2021-03-20 ENCOUNTER — Encounter: Payer: Self-pay | Admitting: Obstetrics and Gynecology

## 2021-05-22 ENCOUNTER — Encounter: Payer: Self-pay | Admitting: Obstetrics and Gynecology

## 2021-05-22 ENCOUNTER — Other Ambulatory Visit: Payer: Self-pay

## 2021-05-22 ENCOUNTER — Ambulatory Visit (INDEPENDENT_AMBULATORY_CARE_PROVIDER_SITE_OTHER): Payer: BC Managed Care – PPO | Admitting: Obstetrics and Gynecology

## 2021-05-22 VITALS — BP 113/68 | HR 78 | Ht 65.0 in | Wt 145.5 lb

## 2021-05-22 DIAGNOSIS — R946 Abnormal results of thyroid function studies: Secondary | ICD-10-CM

## 2021-05-22 DIAGNOSIS — M2550 Pain in unspecified joint: Secondary | ICD-10-CM

## 2021-05-22 DIAGNOSIS — Z1231 Encounter for screening mammogram for malignant neoplasm of breast: Secondary | ICD-10-CM | POA: Diagnosis not present

## 2021-05-22 DIAGNOSIS — Z01419 Encounter for gynecological examination (general) (routine) without abnormal findings: Secondary | ICD-10-CM

## 2021-05-22 DIAGNOSIS — E559 Vitamin D deficiency, unspecified: Secondary | ICD-10-CM | POA: Diagnosis not present

## 2021-05-22 DIAGNOSIS — R5383 Other fatigue: Secondary | ICD-10-CM

## 2021-05-22 DIAGNOSIS — D1720 Benign lipomatous neoplasm of skin and subcutaneous tissue of unspecified limb: Secondary | ICD-10-CM

## 2021-05-22 NOTE — Progress Notes (Signed)
Pt present for annual exam. Pt stated that she was doing well.

## 2021-05-22 NOTE — Progress Notes (Signed)
c  ANNUAL PREVENTATIVE CARE GYNECOLOGY  ENCOUNTER NOTE  Subjective:       Dawn Francis is a 60 y.o. G0P0000 postmenopausal female here for a routine annual gynecologic exam. She has a past history of fibroid uterus. The patient is sexually active. The patient has never taken hormone replacement therapy. Patient denies post-menopausal vaginal bleeding. The patient wears seatbelts: yes. The patient participates in regular exercise: no. Has the patient ever been transfused or tattooed?: no. The patient reports that there is not domestic violence in her life. Denies concern for STDs.   Current complaints: 1. Notes overall just feeling achy in her joints. This has been going on for some time. Areas alternates (ankle, hips, back). Does note that she works two jobs. Also feeling more fatigued.  2.  Reports that she has a knot on her left shoulder, has been present for ~ 1 year. Has tried topical agents which helped it go down some. Was prompted by her sister to seek medical assessment.     Gynecologic History No LMP recorded (lmp unknown). Patient is postmenopausal. Contraception: post menopausal status Last Pap: 03/14/2020. Results were: normal Last mammogram: ~ 04/10/2020. Results were: normal.  Last colonoscopy: 09/2017.  Results were normal.    Obstetric History OB History  Gravida Para Term Preterm AB Living  1 0 0 0 1 0  SAB IAB Ectopic Multiple Live Births  0 0 1 0 0    # Outcome Date GA Lbr Len/2nd Weight Sex Delivery Anes PTL Lv  1 Ectopic 1986            Past Medical History:  Diagnosis Date   COVID-19    Fibroid uterus    History of ovarian cyst    Seizure disorder ([place])     Family History  Problem Relation Age of Onset   Hypertension Mother    Hypertension Father    Seizures Father     Past Surgical History:  Procedure Laterality Date   COLONOSCOPY WITH PROPOFOL N/A 09/15/2017   Procedure: COLONOSCOPY WITH PROPOFOL;  Surgeon: Jonathon Bellows, MD;  Location: [facility]  ENDOSCOPY;  Service: Gastroenterology;  Laterality: N/A;   MYOMECTOMY     x 2    Social History   Socioeconomic History   Marital status: Unknown    Spouse name: Not on file   Number of children: Not on file   Years of education: Not on file   Highest education level: Not on file  Occupational History   Not on file  Tobacco Use   Smoking status: Never   Smokeless tobacco: Never  Vaping Use   Vaping Use: Never used  Substance and Sexual Activity   Alcohol use: Yes    Comment: occass   Drug use: No   Sexual activity: Not Currently    Birth control/protection: Condom  Other Topics Concern   Not on file  Social History Narrative   Not on file   Social Determinants of Health   Financial Resource Strain: Not on file  Food Insecurity: Not on file  Transportation Needs: Not on file  Physical Activity: Not on file  Stress: Not on file  Social Connections: Not on file  Intimate Partner Violence: Not on file    Current Outpatient Medications on File Prior to Visit  Medication Sig Dispense Refill   Ascorbic Acid (VITAMIN C PO) Take by mouth.     lamoTRIgine (LAMICTAL) 100 MG tablet Take 100 mg by mouth 2 (two) times daily.  VITAMIN D PO Take by mouth.     No current facility-administered medications on file prior to visit.    Allergies  Allergen Reactions   Prednisone    Ranitidine Hcl Other (See Comments)    Other Reaction: Not Assessed   Vicodin [Hydrocodone-Acetaminophen]     Review of Systems ROS Review of Systems - General ROS: negative for - chills, fever, hot flashes, night sweats, weight gain or weight loss. Positive for fatigue.  Psychological ROS: negative for - anxiety, decreased libido, depression, mood swings, physical abuse or sexual abuse Ophthalmic ROS: negative for - blurry vision, eye pain or loss of vision ENT ROS: negative for - headaches, hearing change, visual changes or vocal changes Allergy and Immunology ROS: negative for - hives,  itchy/watery eyes or seasonal allergies Hematological and Lymphatic ROS: negative for - bleeding problems, bruising, swollen lymph nodes or weight loss Endocrine ROS: negative for - galactorrhea, hair pattern changes, hot flashes, malaise/lethargy, mood swings, palpitations, polydipsia/polyuria, skin changes, temperature intolerance or unexpected weight changes Breast ROS: negative for - new or changing breast lumps or nipple discharge Respiratory ROS: negative for - cough or shortness of breath Cardiovascular ROS: negative for - chest pain, irregular heartbeat, palpitations or shortness of breath Gastrointestinal ROS: no abdominal pain or black or bloody stools.  Genito-Urinary ROS: no dysuria, trouble voiding, or hematuria Musculoskeletal ROS: negative for - joint stiffness. Positive for joint pain (intermittent), see HPI Neurological ROS: negative for - bowel and bladder control changes Dermatological ROS: negative for rash and skin lesion changes   Objective:   BP 113/68   Pulse 78   Ht 5\' 5"  (1.651 m)   Wt 145 lb 8 oz (66 kg)   LMP  (LMP Unknown) Comment: over 1 year ago  BMI 24.21 kg/m  CONSTITUTIONAL: Well-developed, well-nourished female in no acute distress.  PSYCHIATRIC: Normal mood and affect. Normal behavior. Normal judgment and thought content. [place]: Alert and oriented to person, place, and time. Normal muscle tone coordination. No cranial nerve deficit noted. HENT:  Normocephalic, atraumatic, External right and left ear normal. Oropharynx is clear and moist EYES: Conjunctivae and EOM are normal. Pupils are equal, round, and reactive to light. No scleral icterus.  NECK: Normal range of motion, supple, no masses.  Normal thyroid.  SKIN: Skin is warm and dry. No rash noted. Not diaphoretic. No erythema. No pallor.  Slightly raised soft, fleshy well-circumscribed subcutaneous mass, non-tender, no erythema< ~ 3 x 3.5 cm. CARDIOVASCULAR: Normal heart rate noted, regular  rhythm, no murmur. RESPIRATORY: Clear to auscultation bilaterally. Effort and breath sounds normal, no problems with respiration noted. BREASTS: Symmetric in size. No masses, skin changes, nipple drainage, or lymphadenopathy. ABDOMEN: Soft, normal bowel sounds, no distention noted.  No tenderness, rebound or guarding.  BLADDER: Normal PELVIC:  Bladder no bladder distension noted  Urethra: normal appearing urethra with no masses, tenderness or lesions  Vulva: normal appearing vulva with no masses, tenderness or lesions  Vagina: normal appearing vagina with normal color and discharge, no lesions  Cervix: normal appearing cervix without discharge or lesions and cervical stenosis present  Uterus: uterus is normal size, shape, consistency and nontender  Adnexa: normal adnexa in size, nontender and no masses  RV: External Exam NormaI, No Rectal Masses and Normal Sphincter tone MUSCULOSKELETAL: Normal range of motion. No tenderness.  No cyanosis, clubbing, or edema.  2+ distal pulses. LYMPHATIC: No Axillary, Supraclavicular, or Inguinal Adenopathy.   Labs:  No labs since 2021.  Assessment:   1.  Encounter for well woman exam with routine gynecological exam   2. Breast cancer screening by mammogram   3. Vitamin D deficiency   4. Fatigue, unspecified type   5. Arthralgia, unspecified joint   6. Lipoma of extremity     Plan:  Pap: Pap Co Test performed today.  Mammogram:  Scheduled.  Colon Cancer screening:  Not Indicated.  Patient is up to date on colonoscopy.  Labs: See orders.  Routine preventative health maintenance measures emphasized: Exercise/Diet/Weight control, Alcohol/Substance use risks and Stress Management  Encouraged Vitamin D and calcium supplementation. Has h/o of Vitamin D deficiency.  Patient with suspected lipoma, advised that it could be monitored, if it continues to grow in size, change in nature, or becomes bothersome, would recommend surgical removal.  Fatigue and  arthralgia, discussed possibility of inflammatory or systemic condition. See orders. History of Vitamin D deficiency, will check labs.  COVID vaccination: Patient has not had vaccination.  Return to [place] or sooner as needed.    Rubie Maid, MD [facility]

## 2021-05-22 NOTE — Patient Instructions (Signed)
Preventive Care 58-58 Years Old, Female Preventive care refers to lifestyle choices and visits with your health care provider that can promote health and wellness. This includes: A yearly physical exam. This is also called an annual wellness visit. Regular dental and eye exams. Immunizations. Screening for certain conditions. Healthy lifestyle choices, such as: Eating a healthy diet. Getting regular exercise. Not using drugs or products that contain nicotine and tobacco. Limiting alcohol use. What can I expect for my preventive care visit? Physical exam Your health care provider will check your: Height and weight. These may be used to calculate your BMI (body mass index). BMI is a measurement that tells if you are at a healthy weight. Heart rate and blood pressure. Body temperature. Skin for abnormal spots. Counseling Your health care provider may ask you questions about your: Past medical problems. Family's medical history. Alcohol, tobacco, and drug use. Emotional well-being. Home life and relationship well-being. Sexual activity. Diet, exercise, and sleep habits. Work and work Statistician. Access to firearms. Method of birth control. Menstrual cycle. Pregnancy history. What immunizations do I need?  Vaccines are usually given at various ages, according to a schedule. Your health care provider will recommend vaccines for you based on your age, medicalhistory, and lifestyle or other factors, such as travel or where you work. What tests do I need? Blood tests Lipid and cholesterol levels. These may be checked every 5 years, or more often if you are over 58 years old. Hepatitis C test. Hepatitis B test. Screening Lung cancer screening. You may have this screening every year starting at age 30 if you have a 30-pack-year history of smoking and currently smoke or have quit within the past 15 years. Colorectal cancer screening. All adults should have this screening starting at  age 58 and continuing until age 3. Your health care provider may recommend screening at age 58 if you are at increased risk. You will have tests every 1-10 years, depending on your results and the type of screening test. Diabetes screening. This is done by checking your blood sugar (glucose) after you have not eaten for a while (fasting). You may have this done every 1-3 years. Mammogram. This may be done every 1-2 years. Talk with your health care provider about when you should start having regular mammograms. This may depend on whether you have a family history of breast cancer. BRCA-related cancer screening. This may be done if you have a family history of breast, ovarian, tubal, or peritoneal cancers. Pelvic exam and Pap test. This may be done every 3 years starting at age 58. Starting at age 54, this may be done every 5 years if you have a Pap test in combination with an HPV test. Other tests STD (sexually transmitted disease) testing, if you are at risk. Bone density scan. This is done to screen for osteoporosis. You may have this scan if you are at high risk for osteoporosis. Talk with your health care provider about your test results, treatment options,and if necessary, the need for more tests. Follow these instructions at home: Eating and drinking  Eat a diet that includes fresh fruits and vegetables, whole grains, lean protein, and low-fat dairy products. Take vitamin and mineral supplements as recommended by your health care provider. Do not drink alcohol if: Your health care provider tells you not to drink. You are pregnant, may be pregnant, or are planning to become pregnant. If you drink alcohol: Limit how much you have to 0-1 drink a day. Be aware  of how much alcohol is in your drink. In the [place] one drink equals one 12 oz bottle of beer (355 mL), one 5 oz glass of wine (148 mL), or one 1 oz glass of hard liquor (44 mL).  Lifestyle Take daily care of your teeth and  gums. Brush your teeth every morning and night with fluoride toothpaste. Floss one time each day. Stay active. Exercise for at least 30 minutes 5 or more days each week. Do not use any products that contain nicotine or tobacco, such as cigarettes, e-cigarettes, and chewing tobacco. If you need help quitting, ask your health care provider. Do not use drugs. If you are sexually active, practice safe sex. Use a condom or other form of protection to prevent STIs (sexually transmitted infections). If you do not wish to become pregnant, use a form of birth control. If you plan to become pregnant, see your health care provider for a prepregnancy visit. If told by your health care provider, take low-dose aspirin daily starting at age 50. Find healthy ways to cope with stress, such as: Meditation, yoga, or listening to music. Journaling. Talking to a trusted person. Spending time with friends and family. Safety Always wear your seat belt while driving or riding in a vehicle. Do not drive: If you have been drinking alcohol. Do not ride with someone who has been drinking. When you are tired or distracted. While texting. Wear a helmet and other protective equipment during sports activities. If you have firearms in your house, make sure you follow all gun safety procedures. What's next? Visit your health care provider once a year for an annual wellness visit. Ask your health care provider how often you should have your eyes and teeth checked. Stay up to date on all vaccines. This information is not intended to replace advice given to you by your health care provider. Make sure you discuss any questions you have with your healthcare provider. Document Revised: 07/31/2020 Document Reviewed: 07/08/2018 Elsevier Patient Education  2022 Elsevier Inc.   Breast Self-Awareness Breast self-awareness means being familiar with how your breasts look and feel. It involves checking your breasts regularly and  reporting any changes to yourhealth care provider. Practicing breast self-awareness is important. Sometimes changes may not be harmful (are benign), but sometimes a change in your breasts can be a sign of a serious medical problem. It is important to learn how to do this procedure correctly so that you can catch problems early, when treatment is more likely to be successful. All women should practice breast self-awareness, including women who have hadbreast implants. What you need: A mirror. A well-lit room. How to do a breast self-exam A breast self-exam is one way to learn what is normal for your breasts andwhether your breasts are changing. To do a breast self-exam: Look for changes  Remove all the clothing above your waist. Stand in front of a mirror in a room with good lighting. Put your hands on your hips. Push your hands firmly downward. Compare your breasts in the mirror. Look for differences between them (asymmetry), such as: Differences in shape. Differences in size. Puckers, dips, and bumps in one breast and not the other. Look at each breast for changes in the skin, such as: Redness. Scaly areas. Look for changes in your nipples, such as: Discharge. Bleeding. Dimpling. Redness. A change in position.  Feel for changes Carefully feel your breasts for lumps and changes. It is best to do this while lying on your back   on the floor, and again while sitting or standing in the tub or shower with soapy water on your skin. Feel each breast in the following way: Place the arm on the side of the breast you are examining above your head. Feel your breast with the other hand. Start in the nipple area and make -inch (2 cm) overlapping circles to feel your breast. Use the pads of your three middle fingers to do this. Apply light pressure, then medium pressure, then firm pressure. The light pressure will allow you to feel the tissue closest to the skin. The medium pressure will allow you  to feel the tissue that is a little deeper. The firm pressure will allow you to feel the tissue close to the ribs. Continue the overlapping circles, moving downward over the breast until you feel your ribs below your breast. Move one finger-width toward the center of the body. Continue to use the -inch (2 cm) overlapping circles to feel your breast as you move slowly up toward your collarbone. Continue the up-and-down exam using all three pressures until you reach your armpit.  Write down what you find Writing down what you find can help you remember what to discuss with your health care provider. Write down: What is normal for each breast. Any changes that you find in each breast, including: The kind of changes you find. Any pain or tenderness. Size and location of any lumps. Where you are in your menstrual cycle, if you are still menstruating. General tips and recommendations Examine your breasts every month. If you are breastfeeding, the best time to examine your breasts is after a feeding or after using a breast pump. If you menstruate, the best time to examine your breasts is 5-7 days after your period. Breasts are generally lumpier during menstrual periods, and it may be more difficult to notice changes. With time and practice, you will become more familiar with the variations in your breasts and more comfortable with the exam. Contact a health care provider if you: See a change in the shape or size of your breasts or nipples. See a change in the skin of your breast or nipples, such as a reddened or scaly area. Have unusual discharge from your nipples. Find a lump or thick area that was not there before. Have pain in your breasts. Have any concerns related to your breast health. Summary Breast self-awareness includes looking for physical changes in your breasts, as well as feeling for any changes within your breasts. Breast self-awareness should be performed in front of a mirror in  a well-lit room. You should examine your breasts every month. If you menstruate, the best time to examine your breasts is 5-7 days after your menstrual period. Let your health care provider know of any changes you notice in your breasts, including changes in size, changes on the skin, pain or tenderness, or unusual fluid from your nipples. This information is not intended to replace advice given to you by your health care provider. Make sure you discuss any questions you have with your healthcare provider. Document Revised: 06/15/2018 Document Reviewed: 06/15/2018 Elsevier Patient Education  2022 Elsevier Inc.  

## 2021-05-23 LAB — LIPID PANEL
Chol/HDL Ratio: 3.8 ratio (ref 0.0–4.4)
Cholesterol, Total: 229 mg/dL — ABNORMAL HIGH (ref 100–199)
HDL: 60 mg/dL (ref >39)
LDL Chol Calc (NIH): 138 mg/dL — ABNORMAL HIGH (ref 0–99)
Triglycerides: 174 mg/dL — ABNORMAL HIGH (ref 0–149)
VLDL Cholesterol Cal: 31 mg/dL (ref 5–40)

## 2021-05-23 LAB — COMPREHENSIVE METABOLIC PANEL
ALT: 13 IU/L (ref 0–32)
AST: 17 IU/L (ref 0–40)
Albumin/Globulin Ratio: 1.9 (ref 1.2–2.2)
Albumin: 5.1 g/dL — ABNORMAL HIGH (ref 3.8–4.9)
Alkaline Phosphatase: 55 IU/L (ref 44–121)
BUN/Creatinine Ratio: 13 (ref 9–23)
BUN: 14 mg/dL (ref 6–24)
Bilirubin Total: 0.4 mg/dL (ref 0.0–1.2)
CO2: 27 mmol/L (ref 20–29)
Calcium: 10.2 mg/dL (ref 8.7–10.2)
Chloride: 99 mmol/L (ref 96–106)
Creatinine, Ser: 1.04 mg/dL — ABNORMAL HIGH (ref 0.57–1.00)
Globulin, Total: 2.7 g/dL (ref 1.5–4.5)
Glucose: 54 mg/dL — ABNORMAL LOW (ref 65–99)
Potassium: 4.5 mmol/L (ref 3.5–5.2)
Sodium: 142 mmol/L (ref 134–144)
Total Protein: 7.8 g/dL (ref 6.0–8.5)
eGFR: 62 mL/min/{1.73_m2} (ref >59)

## 2021-05-23 LAB — CBC
Hematocrit: 40.7 % (ref 34.0–46.6)
Hemoglobin: 14.2 g/dL (ref 11.1–15.9)
MCH: 31.3 pg (ref 26.6–33.0)
MCHC: 34.9 g/dL (ref 31.5–35.7)
MCV: 90 fL (ref 79–97)
Platelets: 275 10*3/uL (ref 150–450)
RBC: 4.54 x10E6/uL (ref 3.77–5.28)
RDW: 12.3 % (ref 11.7–15.4)
WBC: 3.6 10*3/uL (ref 3.4–10.8)

## 2021-05-23 LAB — ANA: Anti Nuclear Antibody (ANA): NEGATIVE

## 2021-05-23 LAB — HEMOGLOBIN A1C
Est. average glucose Bld gHb Est-mCnc: 120 mg/dL
Hgb A1c MFr Bld: 5.8 % — ABNORMAL HIGH (ref 4.8–5.6)

## 2021-05-23 LAB — VITAMIN B12: Vitamin B-12: 423 pg/mL (ref 232–1245)

## 2021-05-23 LAB — VITAMIN D 25 HYDROXY (VIT D DEFICIENCY, FRACTURES): Vit D, 25-Hydroxy: 33.4 ng/mL (ref 30.0–100.0)

## 2021-05-23 LAB — TSH: TSH: 5.25 u[IU]/mL — ABNORMAL HIGH (ref 0.450–4.500)

## 2021-05-24 LAB — C-REACTIVE PROTEIN: CRP: <1 mg/L (ref 0–10)

## 2021-05-24 LAB — SPECIMEN STATUS REPORT

## 2021-05-25 NOTE — Addendum Note (Signed)
Addended by: Augusto Gamble on: 05/25/2021 12:53 AM   Modules accepted: Orders

## 2021-09-23 ENCOUNTER — Other Ambulatory Visit: Payer: Self-pay | Admitting: Family

## 2021-09-23 DIAGNOSIS — S060X0D Concussion without loss of consciousness, subsequent encounter: Secondary | ICD-10-CM

## 2021-09-25 ENCOUNTER — Other Ambulatory Visit: Payer: Self-pay

## 2021-09-25 ENCOUNTER — Ambulatory Visit
Admission: RE | Admit: 2021-09-25 | Discharge: 2021-09-25 | Disposition: A | Discharge status: Home / Self Care | Payer: Self-pay | Source: Ambulatory Visit | Attending: Family | Admitting: Family

## 2021-09-25 DIAGNOSIS — S060X0D Concussion without loss of consciousness, subsequent encounter: Secondary | ICD-10-CM

## 2021-11-05 DIAGNOSIS — Z20822 Contact with and (suspected) exposure to covid-19: Secondary | ICD-10-CM | POA: Diagnosis not present

## 2021-11-05 DIAGNOSIS — Z03818 Encounter for observation for suspected exposure to other biological agents ruled out: Secondary | ICD-10-CM | POA: Diagnosis not present

## 2021-11-23 IMAGING — MR MR HEAD W/O CM
11 series · 48 of 48 positions shown · non-contrast
Comparison: None.

CLINICAL DATA: Fall, headaches

EXAM:
MRI HEAD WITHOUT CONTRAST
TECHNIQUE: Multiplanar, multiecho pulse sequences of the brain and surrounding
structures were obtained without intravenous contrast.

[Series 5: T1 · sagittal · 4.0mm · 0.75mm/px · 2 of 31 slices shown (1 of 2)]
[im 1/31]
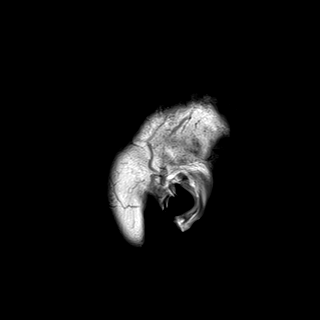
[im 31/31]
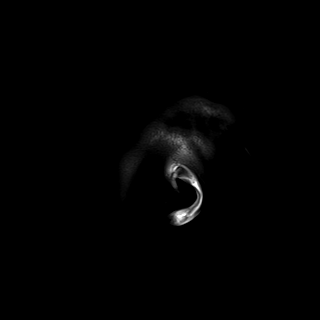

[Series 6: DWI · axial · 3.0mm · 0.94mm/px · z∈[-85,+55]mm · 10 of 159 slices shown (1 of 3)]
[im 1/159]
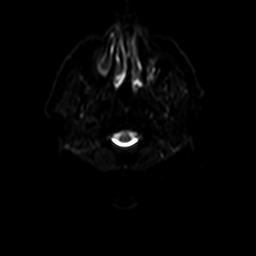
[im 18/159]
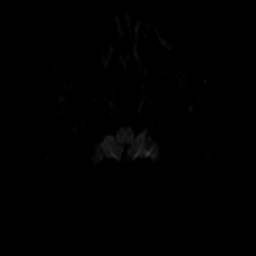
[im 36/159]
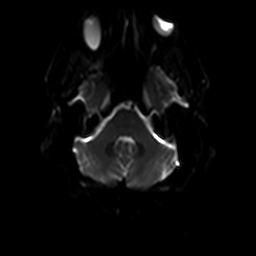
[im 53/159]
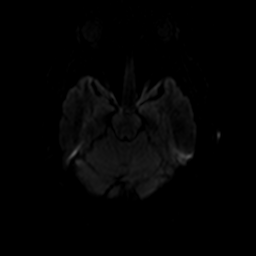
[im 71/159]
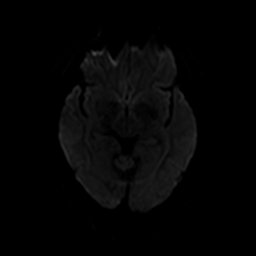
[im 88/159]
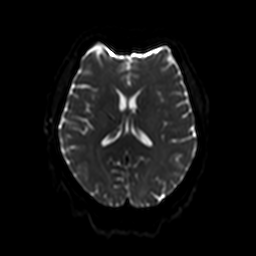
[im 106/159]
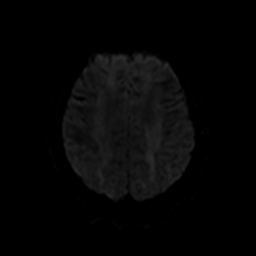
[im 123/159]
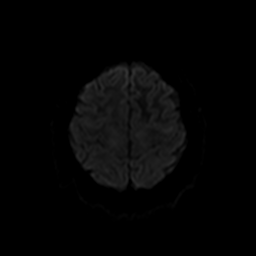
[im 141/159]
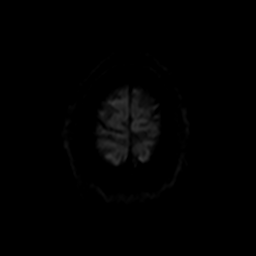
[im 159/159]
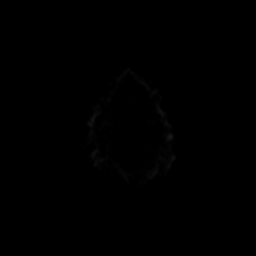

[Series 7: ax dwi_tracew · axial · 3.0mm · 0.94mm/px · z∈[-85,+55]mm · 5 of 79 slices shown]
[im 1/79]
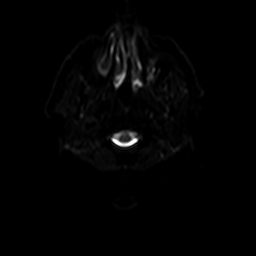
[im 20/79]
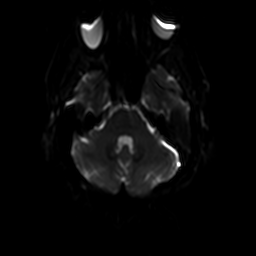
[im 40/79]
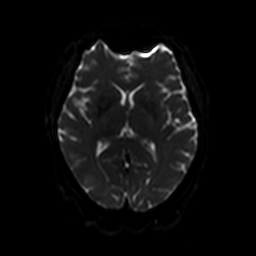
[im 59/79]
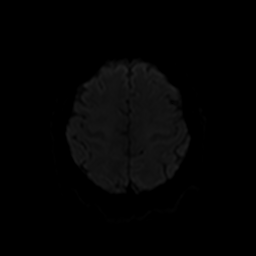
[im 79/79]
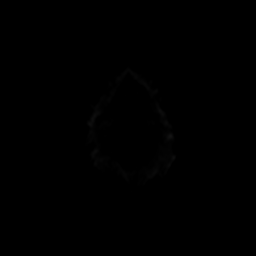

[Series 8: ax dwi_adc · axial · 3.0mm · 0.94mm/px · z∈[-85,+55]mm · 3 of 40 slices shown]
[im 1/40]
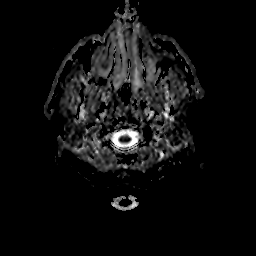
[im 20/40]
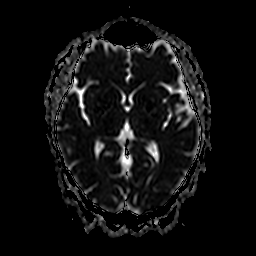
[im 40/40]
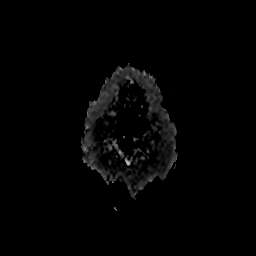

[Series 9: DWI · coronal · 5.0mm · 1.44mm/px · 4 of 60 slices shown (2 of 3)]
[im 1/60]
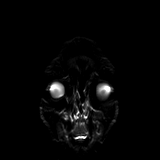
[im 20/60]
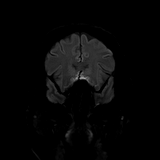
[im 40/60]
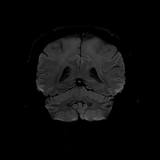
[im 60/60]
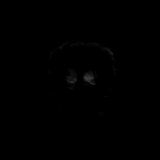

[Series 10: DWI · coronal · 5.0mm · 1.44mm/px · 2 of 30 slices shown (3 of 3)]
[im 1/30]
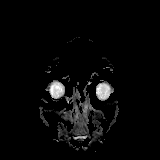
[im 30/30]
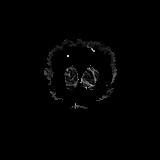

[Series 11: T2 · axial · 4.0mm · 0.36mm/px · z∈[-91,+43]mm · 2 of 27 slices shown (1 of 2)]
[im 1/27]
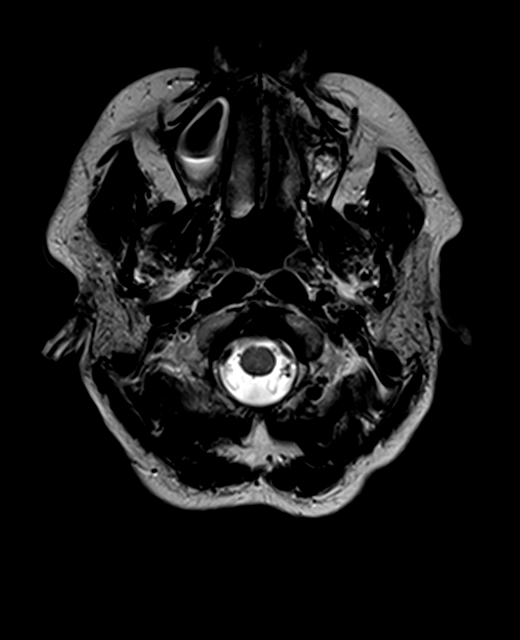
[im 27/27]
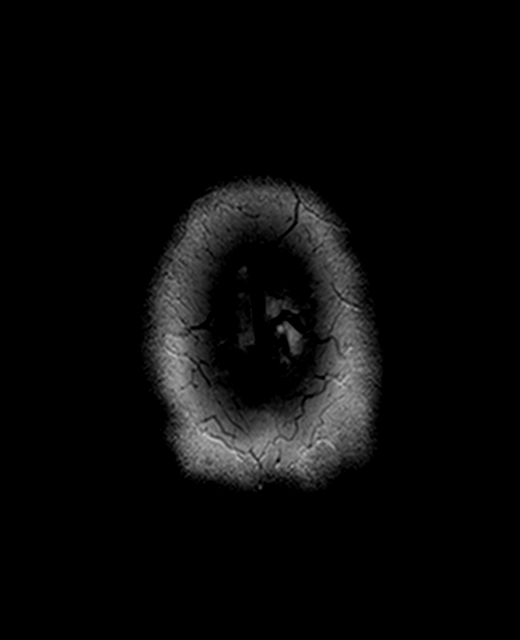

[Series 12: FLAIR · axial · 3.0mm · 0.72mm/px · z∈[-99,+50]mm · 2 of 26 slices shown]
[im 1/26]
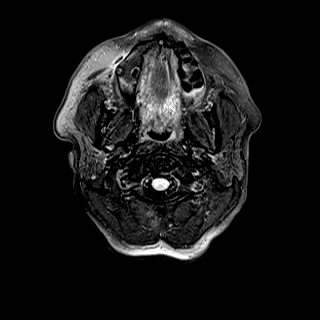
[im 26/26]
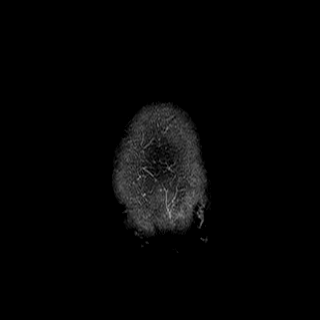

[Series 13: swi_images · axial · 1.5mm · 0.90mm/px · z∈[-95,+47]mm · 6 of 96 slices shown]
[im 1/96]
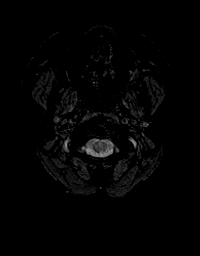
[im 20/96]
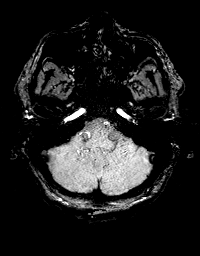
[im 39/96]
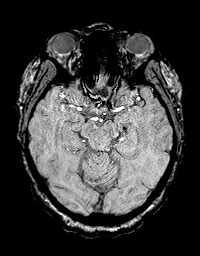
[im 58/96]
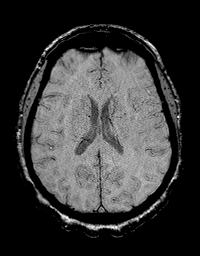
[im 77/96]
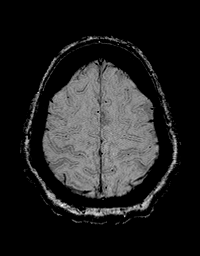
[im 96/96]
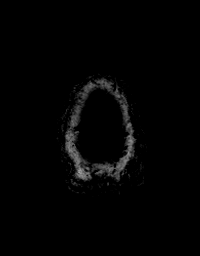

[Series 15: T1 · axial · 1.0mm · 0.94mm/px · z∈[-104,+54]mm · 10 of 160 slices shown (2 of 2)]
[im 1/160]
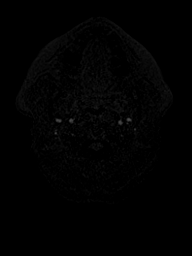
[im 18/160]
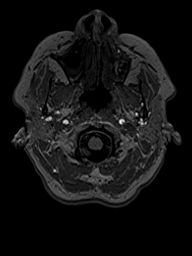
[im 36/160]
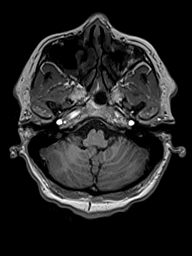
[im 54/160]
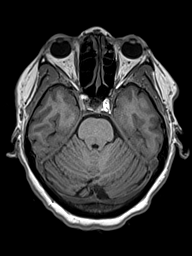
[im 71/160]
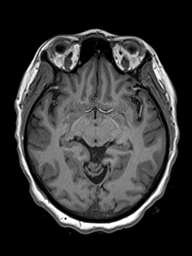
[im 89/160]
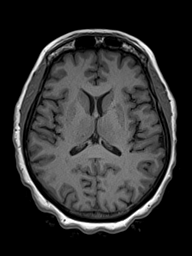
[im 107/160]
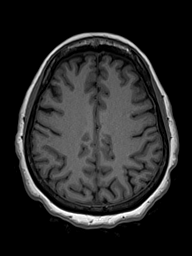
[im 124/160]
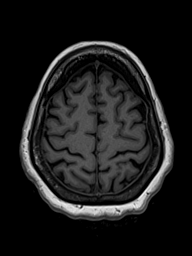
[im 142/160]
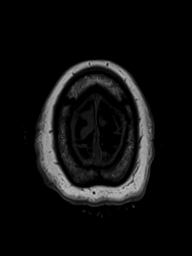
[im 160/160]
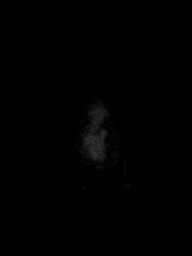

[Series 16: T2 · coronal · 4.5mm · 0.36mm/px · 2 of 30 slices shown (2 of 2)]
[im 1/30]
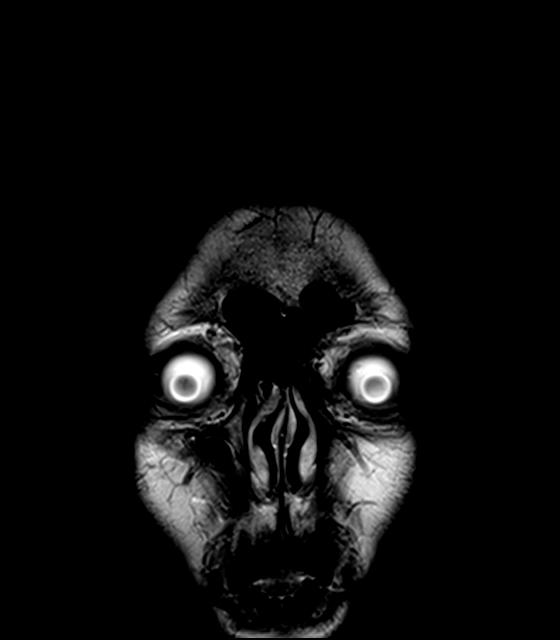
[im 30/30]
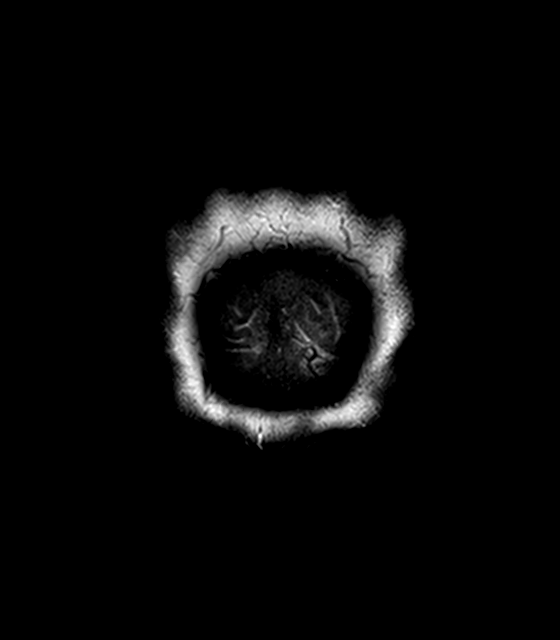

[48 of 48 positions shown; findings below may reference images not displayed]

FINDINGS: Brain: There is no acute infarction or intracranial hemorrhage.
There is no intracranial mass, mass effect, or edema. There is no
hydrocephalus or extra-axial fluid collection. Ventricles and sulci
are normal in size and configuration. Minimal punctate foci of T2
hyperintensity in the supratentorial white matter likely reflecting
nonspecific gliosis/demyelination of doubtful significance.

Vascular: Major vessel flow voids at the skull base are preserved.

Skull and upper cervical spine: Normal marrow signal is preserved.

Sinuses/Orbits: Paranasal sinus mucosal thickening. Hypoplastic left
maxillary sinus. Small air-fluid level right maxillary sinus. Orbits
are unremarkable.

Other: Sella is unremarkable.  Mastoid air cells are clear.
IMPRESSION: No evidence of recent infarction, hemorrhage, or mass.

Nonspecific small right maxillary sinus air-fluid level, which can
be present in the setting of acute sinusitis.

## 2022-07-02 LAB — BASIC METABOLIC PANEL
BUN: 17 (ref 4–21)
CO2: 23 — AB (ref 13–22)
Chloride: 101 (ref 99–108)
Creatinine: 1 (ref 0.5–1.1)
Glucose: 82
Potassium: 4.8 mEq/L (ref 3.5–5.1)
Sodium: 143 (ref 137–147)

## 2022-07-02 LAB — HEMOGLOBIN A1C: Hemoglobin A1C: 5.7

## 2022-07-02 LAB — CBC AND DIFFERENTIAL
HCT: 41 (ref 36–46)
Hemoglobin: 13.9 (ref 12.0–16.0)
Neutrophils Absolute: 1.2
Platelets: 260 10*3/uL (ref 150–400)
WBC: 3.8

## 2022-07-02 LAB — LIPID PANEL
Cholesterol: 221 — AB (ref 0–200)
HDL: 71 — AB (ref 35–70)
LDL Cholesterol: 137
Triglycerides: 77 (ref 40–160)

## 2022-07-02 LAB — CBC: RBC: 4.54 (ref 3.87–5.11)

## 2022-07-02 LAB — HEPATIC FUNCTION PANEL
ALT: 14 U/L (ref 7–35)
AST: 18 (ref 13–35)
Alkaline Phosphatase: 49 (ref 25–125)
Bilirubin, Total: 0.6

## 2022-07-02 LAB — COMPREHENSIVE METABOLIC PANEL
Albumin: 5.1 — AB (ref 3.5–5.0)
Calcium: 9.9 (ref 8.7–10.7)
Globulin: 2.6
eGFR: 67

## 2022-07-02 LAB — VITAMIN B12: Vitamin B-12: 347

## 2022-07-02 LAB — TSH: TSH: 4.01 (ref 0.41–5.90)

## 2022-07-02 LAB — VITAMIN D 25 HYDROXY (VIT D DEFICIENCY, FRACTURES): Vit D, 25-Hydroxy: 22.4

## 2022-12-16 ENCOUNTER — Encounter: Payer: Self-pay | Admitting: Family

## 2022-12-16 ENCOUNTER — Ambulatory Visit (INDEPENDENT_AMBULATORY_CARE_PROVIDER_SITE_OTHER): Payer: No Typology Code available for payment source | Admitting: Family

## 2022-12-16 VITALS — BP 120/62 | HR 105 | Ht 62.0 in | Wt 139.2 lb

## 2022-12-16 DIAGNOSIS — E559 Vitamin D deficiency, unspecified: Secondary | ICD-10-CM

## 2022-12-16 DIAGNOSIS — Z6825 Body mass index (BMI) 25.0-25.9, adult: Secondary | ICD-10-CM

## 2022-12-16 DIAGNOSIS — E7800 Pure hypercholesterolemia, unspecified: Secondary | ICD-10-CM

## 2022-12-16 DIAGNOSIS — E782 Mixed hyperlipidemia: Secondary | ICD-10-CM

## 2022-12-16 DIAGNOSIS — B0229 Other postherpetic nervous system involvement: Secondary | ICD-10-CM | POA: Insufficient documentation

## 2022-12-16 DIAGNOSIS — G40909 Epilepsy, unspecified, not intractable, without status epilepticus: Secondary | ICD-10-CM

## 2022-12-16 DIAGNOSIS — R7303 Prediabetes: Secondary | ICD-10-CM | POA: Diagnosis not present

## 2022-12-16 DIAGNOSIS — E538 Deficiency of other specified B group vitamins: Secondary | ICD-10-CM

## 2022-12-16 DIAGNOSIS — E785 Hyperlipidemia, unspecified: Secondary | ICD-10-CM | POA: Insufficient documentation

## 2022-12-16 MED ORDER — GABAPENTIN 100 MG PO CAPS: 100.0000 mg | ORAL_CAPSULE | Freq: Three times a day (TID) | ORAL | 2 refills | Status: AC

## 2022-12-16 NOTE — Assessment & Plan Note (Signed)
Checking Lipid Panel today. Will consider meds if needed.

## 2022-12-16 NOTE — Progress Notes (Signed)
Established Patient Office Visit  Subjective   Patient ID: Dawn Francis, female    DOB: 06-11-1961  Age: 62 y.o. 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[place])    History of ovarian cyst    Seizure disorder (HCC)    Social History   Tobacco Use   Smoking status: Never   Smokeless tobacco: Never  Vaping Use   Vaping Use: Never used  Substance Use Topics   Alcohol use: Yes    Comment: occass   Drug use: No   Family History  Problem Relation Age of Onset   Hypertension Mother    Hypertension Father    Seizures Father    Allergies  Allergen Reactions   Hydrocodone-Acetaminophen Other (See Comments)   Prednisone Other (See Comments)    Loss of eyesight   Ranitidine Hcl Other (See Comments)    Other Reaction: Not Assessed   Vicodin [Hydrocodone-Acetaminophen]       ROS    Objective:     BP 120/62   Pulse (!) 105   Ht '5\' 2"'$  (1.575 m)   Wt 139 lb 3.2 oz (63.1 kg)   LMP  (LMP Unknown) Comment: over 1 year ago  SpO2 97%   BMI 25.46 kg/m  BP Readings from Last 3 Encounters:  12/16/22 120/62  07/17/22 132/78  05/22/21 113/68   Wt Readings from Last 3 Encounters:  12/16/22 139 lb 3.2 oz (63.1 kg)  07/17/22 140 lb 9.6 oz (63.8 kg)  05/22/21 145 lb 8 oz (66 kg)      Physical Exam   Results for orders placed or performed in visit on 12/16/22  CBC and differential  Result Value Ref Range   Hemoglobin 13.9 12.0 - 16.0   HCT 41 36 - 46   Neutrophils Absolute 1.20    Platelets 260 150 - 400 K/uL   WBC 3.8   CBC  Result Value Ref Range   RBC 4.54 3.87 - 5.11  VITAMIN D 25 Hydroxy (Vit-D Deficiency, Fractures)  Result Value Ref Range   Vit D, 25-Hydroxy 62.3   Basic metabolic panel  Result Value Ref Range   Glucose 82    BUN 17 4 - 21   CO2 23 (A) 13 - 22   Creatinine 1.0 0.5 - 1.1    Potassium 4.8 3.5 - 5.1 mEq/L   Sodium 143 137 - 147   Chloride 101 99 - 108  Comprehensive metabolic panel  Result Value Ref Range   Globulin 2.6    eGFR 67    Calcium 9.9 8.7 - 10.7   Albumin 5.1 (A) 3.5 - 5.0  Lipid panel  Result Value Ref Range   Triglycerides 77 40 - 160   Cholesterol 221 (A) 0 - 200   HDL 71 (A) 35 - 70   LDL Cholesterol 137   Hepatic function panel  Result Value Ref Range   Alkaline Phosphatase 49 25 - 125   ALT 14 7 - 35 U/L   AST 18 13 - 35   Bilirubin, Total 0.6   Vitamin B12  Result Value Ref Range   Vitamin B-12 347   Hemoglobin A1c  Result Value Ref Range   Hemoglobin A1C 5.7   TSH  Result Value Ref Range  TSH 4.01 0.41 - 5.90    The 10-year ASCVD risk score (Arnett DK, et al., 2019) is: 3.6%* (Cholesterol units were assumed)    Assessment & Plan:   Problem List Items Addressed This Visit     Seizure disorder ([place])   Relevant Medications   gabapentin (NEURONTIN) 100 MG capsule   Hyperlipidemia    Checking Lipid Panel today. Will consider meds if needed.       Relevant Orders   Lipid panel   CBC With Differential   CMP14+EGFR   Post herpetic neuralgia - Primary    Patient given handout regarding postherpetic neuralgia.  RX for gabapentin sent to pharmacy.        Relevant Medications   gabapentin (NEURONTIN) 100 MG capsule   Prediabetes    Checking A1C today.  Will notify pt if changes are needed.        Relevant Orders   Hemoglobin A1c   Other Visit Diagnoses     Vitamin D deficiency, unspecified       Relevant Orders   VITAMIN D 25 Hydroxy (Vit-D Deficiency, Fractures)   B12 deficiency due to diet           Return in about 4 months (around 04/16/2023).    Darling, FNP

## 2022-12-16 NOTE — Assessment & Plan Note (Signed)
Checking A1C today.  Will notify pt if changes are needed.

## 2022-12-16 NOTE — Assessment & Plan Note (Signed)
Patient given handout regarding postherpetic neuralgia.  RX for gabapentin sent to pharmacy.

## 2022-12-17 LAB — CBC WITH DIFFERENTIAL
Basophils Absolute: 0 10*3/uL (ref 0.0–0.2)
Basos: 0 %
EOS (ABSOLUTE): 0 10*3/uL (ref 0.0–0.4)
Eos: 0 %
Hematocrit: 37.9 % (ref 34.0–46.6)
Hemoglobin: 12.8 g/dL (ref 11.1–15.9)
Immature Grans (Abs): 0 10*3/uL (ref 0.0–0.1)
Immature Granulocytes: 0 %
Lymphocytes Absolute: 1.7 10*3/uL (ref 0.7–3.1)
Lymphs: 43 %
MCH: 29.9 pg (ref 26.6–33.0)
MCHC: 33.8 g/dL (ref 31.5–35.7)
MCV: 89 fL (ref 79–97)
Monocytes Absolute: 0.3 10*3/uL (ref 0.1–0.9)
Monocytes: 8 %
Neutrophils Absolute: 1.9 10*3/uL (ref 1.4–7.0)
Neutrophils: 49 %
RBC: 4.28 x10E6/uL (ref 3.77–5.28)
RDW: 12.3 % (ref 11.7–15.4)
WBC: 3.9 10*3/uL (ref 3.4–10.8)

## 2022-12-17 LAB — LIPID PANEL
Chol/HDL Ratio: 3.5 ratio (ref 0.0–4.4)
Cholesterol, Total: 212 mg/dL — ABNORMAL HIGH (ref 100–199)
HDL: 60 mg/dL (ref >39)
LDL Chol Calc (NIH): 139 mg/dL — ABNORMAL HIGH (ref 0–99)
Triglycerides: 71 mg/dL (ref 0–149)
VLDL Cholesterol Cal: 13 mg/dL (ref 5–40)

## 2022-12-17 LAB — CMP14+EGFR
ALT: 18 IU/L (ref 0–32)
AST: 17 IU/L (ref 0–40)
Albumin/Globulin Ratio: 1.8 (ref 1.2–2.2)
Albumin: 4.8 g/dL (ref 3.8–4.9)
Alkaline Phosphatase: 46 IU/L (ref 44–121)
BUN/Creatinine Ratio: 16 (ref 9–23)
BUN: 16 mg/dL (ref 6–24)
Bilirubin Total: 0.6 mg/dL (ref 0.0–1.2)
CO2: 25 mmol/L (ref 20–29)
Calcium: 9.6 mg/dL (ref 8.7–10.2)
Chloride: 100 mmol/L (ref 96–106)
Creatinine, Ser: 0.97 mg/dL (ref 0.57–1.00)
Globulin, Total: 2.7 g/dL (ref 1.5–4.5)
Glucose: 86 mg/dL (ref 70–99)
Potassium: 4 mmol/L (ref 3.5–5.2)
Sodium: 139 mmol/L (ref 134–144)
Total Protein: 7.5 g/dL (ref 6.0–8.5)
eGFR: 67 mL/min/{1.73_m2} (ref >59)

## 2022-12-17 LAB — HEMOGLOBIN A1C
Est. average glucose Bld gHb Est-mCnc: 117 mg/dL
Hgb A1c MFr Bld: 5.7 % — ABNORMAL HIGH (ref 4.8–5.6)

## 2022-12-17 LAB — VITAMIN D 25 HYDROXY (VIT D DEFICIENCY, FRACTURES): Vit D, 25-Hydroxy: 18.7 ng/mL — ABNORMAL LOW (ref 30.0–100.0)

## 2023-04-16 ENCOUNTER — Ambulatory Visit: Payer: No Typology Code available for payment source | Admitting: Family

## 2023-04-20 ENCOUNTER — Ambulatory Visit (INDEPENDENT_AMBULATORY_CARE_PROVIDER_SITE_OTHER): Payer: No Typology Code available for payment source | Admitting: Family

## 2023-04-20 ENCOUNTER — Encounter: Payer: Self-pay | Admitting: Family

## 2023-04-20 VITALS — BP 112/72 | HR 81 | Ht 62.0 in | Wt 144.8 lb

## 2023-04-20 DIAGNOSIS — E782 Mixed hyperlipidemia: Secondary | ICD-10-CM | POA: Diagnosis not present

## 2023-04-20 DIAGNOSIS — E559 Vitamin D deficiency, unspecified: Secondary | ICD-10-CM

## 2023-04-20 DIAGNOSIS — Z1231 Encounter for screening mammogram for malignant neoplasm of breast: Secondary | ICD-10-CM

## 2023-04-20 DIAGNOSIS — R7303 Prediabetes: Secondary | ICD-10-CM | POA: Diagnosis not present

## 2023-04-20 DIAGNOSIS — R5383 Other fatigue: Secondary | ICD-10-CM | POA: Diagnosis not present

## 2023-04-20 DIAGNOSIS — E538 Deficiency of other specified B group vitamins: Secondary | ICD-10-CM

## 2023-04-20 DIAGNOSIS — R6884 Jaw pain: Secondary | ICD-10-CM

## 2023-04-20 DIAGNOSIS — E7800 Pure hypercholesterolemia, unspecified: Secondary | ICD-10-CM

## 2023-04-20 DIAGNOSIS — M25512 Pain in left shoulder: Secondary | ICD-10-CM

## 2023-04-20 DIAGNOSIS — G40909 Epilepsy, unspecified, not intractable, without status epilepticus: Secondary | ICD-10-CM

## 2023-04-20 NOTE — Assessment & Plan Note (Signed)

## 2023-04-20 NOTE — Assessment & Plan Note (Signed)
Checking labs today.  Will continue supplements as needed.  

## 2023-04-20 NOTE — Progress Notes (Signed)
Established Patient Office Visit  Subjective:  Patient ID: Dawn Francis, female    DOB: 1961-03-31  Age: 62 y.o. MRN: [id number]  Chief Complaint  Patient presents with   Follow-up    4 month follow up    Patient is here today for her 3 months follow up.  She has been feeling fairly well since last appointment.   She does have additional concerns to discuss today.  She has been having an increase in general aches, says that she has been hurting more.  She has a lump on her shoulder that her sister told her she should have [place] take a look at. She also says that she has been having some jaw pain that happened suddenly, and some shoulder pain.   Labs are due today. She needs refills.   I have reviewed her active problem list, medication list, allergies, notes from last encounter, lab results for her appointment today.      No other concerns at this time.   Past Medical History:  Diagnosis Date   COVID-19    Fibroid uterus    Herpes simplex viremia (HCC)    History of ovarian cyst    Seizure disorder [facility])     Past Surgical History:  Procedure Laterality Date   COLONOSCOPY WITH PROPOFOL N/A 09/15/2017   Procedure: COLONOSCOPY WITH PROPOFOL;  Surgeon: Wyline Mood, MD;  Location: [facility] ENDOSCOPY;  Service: Gastroenterology;  Laterality: N/A;   MYOMECTOMY     x 2    Social History   Socioeconomic History   Marital status: Single    Spouse name: Not on file   Number of children: Not on file   Years of education: Not on file   Highest education level: Not on file  Occupational History   Not on file  Tobacco Use   Smoking status: Never   Smokeless tobacco: Never  Vaping Use   Vaping Use: Never used  Substance and Sexual Activity   Alcohol use: Yes    Comment: occass   Drug use: No   Sexual activity: Not Currently    Birth control/protection: Condom  Other Topics Concern   Not on file  Social History Narrative   Not on file   Social Determinants of  Health   Financial Resource Strain: Not on file  Food Insecurity: Not on file  Transportation Needs: Not on file  Physical Activity: Not on file  Stress: Not on file  Social Connections: Not on file  Intimate Partner Violence: Not on file    Family History  Problem Relation Age of Onset   Hypertension Mother    Hypertension Father    Seizures Father     Allergies  Allergen Reactions   Hydrocodone-Acetaminophen Other (See Comments)   Prednisone Other (See Comments)    Loss of eyesight   Ranitidine Hcl Other (See Comments)    Other Reaction: Not Assessed   Vicodin [Hydrocodone-Acetaminophen]     Review of Systems  All other systems reviewed and are negative.      Objective:   BP 112/72   Pulse 81   Ht 5\' 2"  (1.575 m)   Wt 144 lb 12.8 oz (65.7 kg)   LMP  (LMP Unknown) Comment: over 1 year ago  SpO2 97%   BMI 26.48 kg/m   Vitals:   04/20/23 1357  BP: 112/72  Pulse: 81  Height: 5\' 2"  (1.575 m)  Weight: 144 lb 12.8 oz (65.7 kg)  SpO2: 97%  BMI (Calculated): 26.48  Physical Exam Vitals and nursing note reviewed.  Constitutional:      Appearance: Normal appearance. She is normal weight.  HENT:     Head: Normocephalic.  Eyes:     Pupils: Pupils are equal, round, and reactive to light.  Cardiovascular:     Rate and Rhythm: Normal rate.  Pulmonary:     Effort: Pulmonary effort is normal.  Skin:      Neurological:     General: No focal deficit present.     Mental Status: She is alert and oriented to person, place, and time. Mental status is at baseline.  Psychiatric:        Mood and Affect: Mood normal.        Behavior: Behavior normal.        Thought Content: Thought content normal.        Judgment: Judgment normal.      No results found for any visits on 04/20/23.  No results found for this or any previous visit (from the past 2160 hour(s)).     Assessment & Plan:   Problem List Items Addressed This Visit       Active Problems    Seizure disorder [facility])    Patient stable.  Well controlled with current therapy.   Continue current meds.       Vitamin D deficiency - Primary    Checking labs today.  Will continue supplements as needed.        Relevant Orders   VITAMIN D 25 Hydroxy (Vit-D Deficiency, Fractures)   CBC With Differential   CMP14+EGFR   TSH   Hyperlipidemia    Checking labs today.  Continue current therapy for lipid control. Will modify as needed based on labwork results.       Relevant Orders   CBC With Differential   CMP14+EGFR   TSH   Lipid panel   CBC With Differential   CMP14+EGFR   TSH   Fatigue   Relevant Orders   CBC With Differential   CMP14+EGFR   TSH   Prediabetes    Patient educated on foods that contain carbohydrates and the need to decrease intake.  We discussed prediabetes, and what it means and the need for strict dietary control to prevent progression to type 2 diabetes.  Advised to decrease intake of sugary drinks, including sodas, sweet tea, and some juices, and of starch and sugar heavy foods (ie., potatoes, rice, bread, pasta, desserts). She verbalizes understanding and agreement with the changes discussed today.   A1C Continues to be in prediabetic ranges.  Will reassess at follow up after next lab check.  Patient counseled on dietary choices and verbalized understanding.        Relevant Orders   CBC With Differential   CMP14+EGFR   TSH   Hemoglobin A1c   Other Visit Diagnoses     Encounter for screening mammogram for breast cancer       Mammogram Ordered today.  Patient to call to schedule   Relevant Orders   MM 3D SCREENING MAMMOGRAM BILATERAL BREAST   CBC With Differential   CMP14+EGFR   TSH   B12 deficiency due to diet       Checking labs today.  Will continue supplements as needed.   Relevant Orders   CBC With Differential   CMP14+EGFR   TSH   Vitamin B12   Acute pain of left shoulder       EKG in office today wnl.  Suspect MSK cause.  Relevant Orders   EKG 12-Lead   Jaw pain       EKG in office WNL.  Suspect TMJ. Instructed to use heat and ibuprofen and/or tylenol.   Relevant Orders   EKG 12-Lead       No follow-ups on file.   Total time spent: 30 minutes  Miki Kins, FNP  04/20/2023   This document may have been prepared by [facility] Voice Recognition software and as such may include unintentional dictation errors.

## 2023-04-20 NOTE — Assessment & Plan Note (Signed)
Patient stable.  Well controlled with current therapy.   Continue current meds.  

## 2023-04-20 NOTE — Assessment & Plan Note (Signed)
Checking labs today.  Continue current therapy for lipid control. Will modify as needed based on labwork results.  

## 2023-04-21 LAB — VITAMIN D 25 HYDROXY (VIT D DEFICIENCY, FRACTURES): Vit D, 25-Hydroxy: 17.5 ng/mL — ABNORMAL LOW (ref 30.0–100.0)

## 2023-04-21 LAB — CBC WITH DIFFERENTIAL
Basophils Absolute: 0 10*3/uL (ref 0.0–0.2)
Basos: 0 %
EOS (ABSOLUTE): 0 10*3/uL (ref 0.0–0.4)
Eos: 1 %
Hematocrit: 38.9 % (ref 34.0–46.6)
Hemoglobin: 13 g/dL (ref 11.1–15.9)
Immature Grans (Abs): 0 10*3/uL (ref 0.0–0.1)
Immature Granulocytes: 0 %
Lymphocytes Absolute: 1.8 10*3/uL (ref 0.7–3.1)
Lymphs: 54 %
MCH: 30.3 pg (ref 26.6–33.0)
MCHC: 33.4 g/dL (ref 31.5–35.7)
MCV: 91 fL (ref 79–97)
Monocytes Absolute: 0.4 10*3/uL (ref 0.1–0.9)
Monocytes: 12 %
Neutrophils Absolute: 1.1 10*3/uL — ABNORMAL LOW (ref 1.4–7.0)
Neutrophils: 33 %
RBC: 4.29 x10E6/uL (ref 3.77–5.28)
RDW: 12.3 % (ref 11.7–15.4)
WBC: 3.3 10*3/uL — ABNORMAL LOW (ref 3.4–10.8)

## 2023-04-21 LAB — CMP14+EGFR
ALT: 13 IU/L (ref 0–32)
AST: 17 IU/L (ref 0–40)
Albumin/Globulin Ratio: 1.8
Albumin: 4.6 g/dL (ref 3.8–4.9)
Alkaline Phosphatase: 51 IU/L (ref 44–121)
BUN/Creatinine Ratio: 16 (ref 12–28)
BUN: 15 mg/dL (ref 8–27)
Bilirubin Total: 0.4 mg/dL (ref 0.0–1.2)
CO2: 27 mmol/L (ref 20–29)
Calcium: 9.7 mg/dL (ref 8.7–10.3)
Chloride: 103 mmol/L (ref 96–106)
Creatinine, Ser: 0.91 mg/dL (ref 0.57–1.00)
Globulin, Total: 2.6 g/dL (ref 1.5–4.5)
Glucose: 66 mg/dL — ABNORMAL LOW (ref 70–99)
Potassium: 4.7 mmol/L (ref 3.5–5.2)
Sodium: 142 mmol/L (ref 134–144)
Total Protein: 7.2 g/dL (ref 6.0–8.5)
eGFR: 72 mL/min/{1.73_m2} (ref >59)

## 2023-04-21 LAB — LIPID PANEL
Chol/HDL Ratio: 3.4 ratio (ref 0.0–4.4)
Cholesterol, Total: 204 mg/dL — ABNORMAL HIGH (ref 100–199)
HDL: 60 mg/dL (ref >39)
LDL Chol Calc (NIH): 125 mg/dL — ABNORMAL HIGH (ref 0–99)
Triglycerides: 104 mg/dL (ref 0–149)
VLDL Cholesterol Cal: 19 mg/dL (ref 5–40)

## 2023-04-21 LAB — HEMOGLOBIN A1C
Est. average glucose Bld gHb Est-mCnc: 120 mg/dL
Hgb A1c MFr Bld: 5.8 % — ABNORMAL HIGH (ref 4.8–5.6)

## 2023-04-21 LAB — VITAMIN B12: Vitamin B-12: 960 pg/mL (ref 232–1245)

## 2023-04-21 LAB — TSH: TSH: 5.34 u[IU]/mL — ABNORMAL HIGH (ref 0.450–4.500)

## 2023-06-22 ENCOUNTER — Other Ambulatory Visit: Payer: Self-pay | Admitting: Family

## 2023-06-22 ENCOUNTER — Telehealth: Payer: Self-pay | Admitting: Family

## 2023-06-22 DIAGNOSIS — N6320 Unspecified lump in the left breast, unspecified quadrant: Secondary | ICD-10-CM

## 2023-06-22 NOTE — Telephone Encounter (Signed)
Patient left VM that she has a bump on her left breast and the imaging center told her to contact us. Does she need to come in for an exam first or do you want to order imaging?

## 2023-06-23 ENCOUNTER — Other Ambulatory Visit: Payer: Self-pay | Admitting: Family

## 2023-07-08 ENCOUNTER — Ambulatory Visit
Admission: RE | Admit: 2023-07-08 | Discharge: 2023-07-08 | Disposition: A | Discharge status: Home / Self Care | Payer: BC Managed Care – PPO | Source: Ambulatory Visit | Attending: Family | Admitting: Family

## 2023-07-08 ENCOUNTER — Ambulatory Visit
Admission: RE | Admit: 2023-07-08 | Discharge: 2023-07-08 | Disposition: A | Discharge status: Home / Self Care | Payer: No Typology Code available for payment source | Source: Ambulatory Visit | Attending: Family | Admitting: Family

## 2023-07-08 DIAGNOSIS — N6320 Unspecified lump in the left breast, unspecified quadrant: Secondary | ICD-10-CM

## 2023-07-21 ENCOUNTER — Ambulatory Visit: Payer: No Typology Code available for payment source | Admitting: Family

## 2023-08-06 ENCOUNTER — Other Ambulatory Visit: Payer: Self-pay | Admitting: Sports Medicine

## 2023-08-06 DIAGNOSIS — M5013 Cervical disc disorder with radiculopathy, cervicothoracic region: Secondary | ICD-10-CM

## 2023-08-21 ENCOUNTER — Ambulatory Visit: Payer: No Typology Code available for payment source | Admitting: Family

## 2023-08-30 ENCOUNTER — Other Ambulatory Visit: Payer: No Typology Code available for payment source

## 2023-09-07 ENCOUNTER — Encounter: Payer: Self-pay | Admitting: Sports Medicine

## 2023-09-10 ENCOUNTER — Encounter: Payer: Self-pay | Admitting: Sports Medicine

## 2023-09-13 ENCOUNTER — Ambulatory Visit
Admission: RE | Admit: 2023-09-13 | Discharge: 2023-09-13 | Disposition: A | Discharge status: Home / Self Care | Payer: No Typology Code available for payment source | Source: Ambulatory Visit | Attending: Sports Medicine | Admitting: Sports Medicine

## 2023-09-13 DIAGNOSIS — M5013 Cervical disc disorder with radiculopathy, cervicothoracic region: Secondary | ICD-10-CM

## 2023-11-26 ENCOUNTER — Other Ambulatory Visit: Payer: Self-pay | Admitting: Neurological Surgery

## 2023-12-09 NOTE — Progress Notes (Signed)
Surgical Instructions   Your procedure is scheduled on Thursday December 17, 2023. Report to [facility] Main Entrance "A" at 11:25 A.M., then check in with the Admitting office. Any questions or running late day of surgery: call [phone]  Questions prior to your surgery date: call [phone], Monday-Friday, 8am-4pm. If you experience any cold or flu symptoms such as cough, fever, chills, shortness of breath, etc. between now and your scheduled surgery, please notify us at the above number.     Remember:  Do not eat or drink after midnight the night before your surgery   Take these medicines the morning of surgery with A SIP OF WATER  gabapentin (NEURONTIN)  lamoTRIgine (LAMICTAL)  valACYclovir (VALTREX)   One week prior to surgery, STOP taking any Aspirin (unless otherwise instructed by your surgeon) Aleve, Naproxen, Ibuprofen, Motrin, Advil, Goody's, BC's, all herbal medications, fish oil, and non-prescription vitamins.  This includes your diclofenac (VOLTAREN).                       Do NOT Smoke (Tobacco/Vaping) for 24 hours prior to your procedure.  If you use a CPAP at night, you may bring your mask/headgear for your overnight stay.   You will be asked to remove any contacts, glasses, piercing's, hearing aid's, dentures/partials prior to surgery. Please bring cases for these items if needed.    Patients discharged the day of surgery will not be allowed to drive home, and someone needs to stay with them for 24 hours.  SURGICAL WAITING ROOM VISITATION Patients may have no more than 2 support people in the waiting area - these visitors may rotate.   Pre-op nurse will coordinate an appropriate time for 1 ADULT support person, who may not rotate, to accompany patient in pre-op.  Children under the age of 55 must have an adult with them who is not the patient and must remain in the main waiting area with an adult.  If the patient needs to stay at the hospital during part of  their recovery, the visitor guidelines for inpatient rooms apply.  Please refer to the [facility] website for the visitor guidelines for any additional information.   If you received a COVID test during your pre-op visit  it is requested that you wear a mask when out in public, stay away from anyone that may not be feeling well and notify your surgeon if you develop symptoms. If you have been in contact with anyone that has tested positive in the last 10 days please notify you surgeon.      Pre-operative CHG Bathing Instructions   You can play a key role in reducing the risk of infection after surgery. Your skin needs to be as free of germs as possible. You can reduce the number of germs on your skin by washing with CHG (chlorhexidine gluconate) soap before surgery. CHG is an antiseptic soap that kills germs and continues to kill germs even after washing.   DO NOT use if you have an allergy to chlorhexidine/CHG or antibacterial soaps. If your skin becomes reddened or irritated, stop using the CHG and notify one of our RNs at [phone].              TAKE A SHOWER THE NIGHT BEFORE SURGERY AND THE DAY OF SURGERY    Please keep in mind the following:  DO NOT shave, including legs and underarms, 48 hours prior to surgery.   You may shave your face before/day  of surgery.  Place clean sheets on your bed the night before surgery Use a clean washcloth (not used since being washed) for each shower. DO NOT sleep with pet's night before surgery.  CHG Shower Instructions:  Wash your face and private area with normal soap. If you choose to wash your hair, wash first with your normal shampoo.  After you use shampoo/soap, rinse your hair and body thoroughly to remove shampoo/soap residue.  Turn the water OFF and apply half the bottle of CHG soap to a CLEAN washcloth.  Apply CHG soap ONLY FROM YOUR NECK DOWN TO YOUR TOES (washing for 3-5 minutes)  DO NOT use CHG soap on face, private areas, open  wounds, or sores.  Pay special attention to the area where your surgery is being performed.  If you are having back surgery, having someone wash your back for you may be helpful. Wait 2 minutes after CHG soap is applied, then you may rinse off the CHG soap.  Pat dry with a clean towel  Put on clean pajamas    Additional instructions for the day of surgery: DO NOT APPLY any lotions, deodorants or perfumes.   Do not wear jewelry or makeup Do not wear nail polish, gel polish, artificial nails, or any other type of covering on natural nails (fingers and toes) Do not bring valuables to the hospital. [facility] is not responsible for valuables/personal belongings. Put on clean/comfortable clothes.  Please brush your teeth.  Ask your nurse before applying any prescription medications to the skin.

## 2023-12-10 ENCOUNTER — Other Ambulatory Visit: Payer: Self-pay

## 2023-12-10 ENCOUNTER — Encounter (HOSPITAL_COMMUNITY)
Admission: RE | Admit: 2023-12-10 | Discharge: 2023-12-10 | Disposition: A | Discharge status: Home / Self Care | Payer: No Typology Code available for payment source | Source: Ambulatory Visit | Attending: Neurological Surgery | Admitting: Neurological Surgery

## 2023-12-10 ENCOUNTER — Encounter (HOSPITAL_COMMUNITY): Payer: Self-pay

## 2023-12-10 VITALS — BP 135/71 | HR 81 | Temp 98.5°F | Resp 16 | Ht 64.0 in | Wt 149.6 lb

## 2023-12-10 DIAGNOSIS — G40909 Epilepsy, unspecified, not intractable, without status epilepticus: Secondary | ICD-10-CM | POA: Insufficient documentation

## 2023-12-10 DIAGNOSIS — Z01812 Encounter for preprocedural laboratory examination: Secondary | ICD-10-CM | POA: Insufficient documentation

## 2023-12-10 DIAGNOSIS — Z01818 Encounter for other preprocedural examination: Secondary | ICD-10-CM

## 2023-12-10 HISTORY — DX: Sleep apnea, unspecified: G47.30

## 2023-12-10 HISTORY — DX: Gastro-esophageal reflux disease without esophagitis: K21.9

## 2023-12-10 LAB — CBC
HCT: 38.5 % (ref 36.0–46.0)
Hemoglobin: 12.8 g/dL (ref 12.0–15.0)
MCH: 30.3 pg (ref 26.0–34.0)
MCHC: 33.2 g/dL (ref 30.0–36.0)
MCV: 91 fL (ref 80.0–100.0)
Platelets: 265 10*3/uL (ref 150–400)
RBC: 4.23 MIL/uL (ref 3.87–5.11)
RDW: 12.5 % (ref 11.5–15.5)
WBC: 4 10*3/uL (ref 4.0–10.5)
nRBC: 0 % (ref 0.0–0.2)

## 2023-12-10 LAB — BASIC METABOLIC PANEL
Anion gap: 8 (ref 5–15)
BUN: 13 mg/dL (ref 6–20)
CO2: 27 mmol/L (ref 22–32)
Calcium: 9.1 mg/dL (ref 8.9–10.3)
Chloride: 105 mmol/L (ref 98–111)
Creatinine, Ser: 1.15 mg/dL — ABNORMAL HIGH (ref 0.44–1.00)
GFR, Estimated: 55 mL/min — ABNORMAL LOW (ref >60)
Glucose, Bld: 96 mg/dL (ref 70–99)
Potassium: 4.5 mmol/L (ref 3.5–5.1)
Sodium: 140 mmol/L (ref 135–145)

## 2023-12-10 LAB — TYPE AND SCREEN
ABO/RH(D): O POS
Antibody Screen: NEGATIVE

## 2023-12-10 LAB — SURGICAL PCR SCREEN
MRSA, PCR: NEGATIVE
Staphylococcus aureus: POSITIVE — AB

## 2023-12-10 NOTE — Progress Notes (Signed)
PCP - Grayling Congress, FNP Cardiologist -  Neurologist: Dr. Louisa Second, [facility]  PPM/ICD - denies Device Orders - na Rep Notified - na  Chest x-ray - na EKG - 04/20/2023 Stress Test -  ECHO -  Cardiac Cath -   Sleep Study - diagnosed with sleep apnea, uses invisiline CPAP - na  Non-diabetic  Blood Thinner Instructions: denies Aspirin Instructions:denies  ERAS Protcol -NPO  Anesthesia review: Yes, seizures  Patient denies shortness of breath, fever, cough and chest pain at PAT appointment   All instructions explained to the patient, with a verbal understanding of the material. Patient agrees to go over the instructions while at home for a better understanding. Patient also instructed to self quarantine after being tested for COVID-19. The opportunity to ask questions was provided.

## 2023-12-16 NOTE — Anesthesia Preprocedure Evaluation (Addendum)
 Anesthesia Evaluation  Patient identified by MRN, date of birth, ID band Patient awake    Reviewed: Allergy & Precautions, NPO status , Patient's Chart, lab work & pertinent test results  History of Anesthesia Complications Negative for: history of anesthetic complications  Airway Mallampati: III  TM Distance: <3 FB Neck ROM: Limited    Dental  (+) Dental Advisory Given   Pulmonary sleep apnea    Pulmonary exam normal        Cardiovascular negative cardio ROS Normal cardiovascular exam     Neuro/Psych  Headaches, Seizures -, Well Controlled,   negative psych ROS   GI/Hepatic Neg liver ROS,GERD  Controlled,,  Endo/Other   Pre-DM   Renal/GU negative Renal ROS     Musculoskeletal negative musculoskeletal ROS (+)    Abdominal   Peds  Hematology negative hematology ROS (+)   Anesthesia Other Findings HSV  Reproductive/Obstetrics                             Anesthesia Physical Anesthesia Plan  ASA: 3  Anesthesia Plan: General   Post-op Pain Management: Tylenol  PO (pre-op)* and Celebrex  PO (pre-op)*   Induction: Intravenous  PONV Risk Score and Plan: 3 and Treatment may vary due to age or medical condition, Ondansetron , Dexamethasone  and Midazolam   Airway Management Planned: Oral ETT and Video Laryngoscope Planned  Additional Equipment: None  Intra-op Plan:   Post-operative Plan: Extubation in OR  Informed Consent: I have reviewed the patients History and Physical, chart, labs and discussed the procedure including the risks, benefits and alternatives for the proposed anesthesia with the patient or authorized representative who has indicated his/her understanding and acceptance.     Dental advisory given  Plan Discussed with: CRNA and Anesthesiologist  Anesthesia Plan Comments:        Anesthesia Quick Evaluation

## 2023-12-16 NOTE — Progress Notes (Signed)
Patient was called to be informed that the surgery time for tomorrow was changed to 07:30 o'clock. Patient was instructed to be at the hospital at 05:30 o'clock. Patient verbalized understanding.

## 2023-12-17 ENCOUNTER — Other Ambulatory Visit: Payer: Self-pay

## 2023-12-17 ENCOUNTER — Observation Stay (HOSPITAL_COMMUNITY)
Admission: RE | Admit: 2023-12-17 | Discharge: 2023-12-18 | Disposition: A | Discharge status: Home / Self Care | Payer: No Typology Code available for payment source | Attending: Neurological Surgery | Admitting: Neurological Surgery

## 2023-12-17 ENCOUNTER — Encounter (HOSPITAL_COMMUNITY)
Admission: RE | Disposition: A | Discharge status: Home / Self Care | Payer: Self-pay | Source: Home / Self Care | Attending: Neurological Surgery

## 2023-12-17 ENCOUNTER — Ambulatory Visit (HOSPITAL_BASED_OUTPATIENT_CLINIC_OR_DEPARTMENT_OTHER): Payer: Self-pay | Admitting: Anesthesiology

## 2023-12-17 ENCOUNTER — Ambulatory Visit (HOSPITAL_COMMUNITY): Payer: Self-pay | Admitting: Physician Assistant

## 2023-12-17 ENCOUNTER — Ambulatory Visit (HOSPITAL_COMMUNITY): Payer: No Typology Code available for payment source

## 2023-12-17 DIAGNOSIS — M5412 Radiculopathy, cervical region: Principal | ICD-10-CM | POA: Diagnosis present

## 2023-12-17 DIAGNOSIS — G4733 Obstructive sleep apnea (adult) (pediatric): Secondary | ICD-10-CM

## 2023-12-17 DIAGNOSIS — M4802 Spinal stenosis, cervical region: Secondary | ICD-10-CM | POA: Diagnosis not present

## 2023-12-17 DIAGNOSIS — Z8616 Personal history of COVID-19: Secondary | ICD-10-CM | POA: Insufficient documentation

## 2023-12-17 DIAGNOSIS — M4722 Other spondylosis with radiculopathy, cervical region: Principal | ICD-10-CM | POA: Insufficient documentation

## 2023-12-17 DIAGNOSIS — Z79899 Other long term (current) drug therapy: Secondary | ICD-10-CM | POA: Insufficient documentation

## 2023-12-17 DIAGNOSIS — E785 Hyperlipidemia, unspecified: Secondary | ICD-10-CM | POA: Diagnosis not present

## 2023-12-17 HISTORY — PX: ANTERIOR CERVICAL DECOMP/DISCECTOMY FUSION: SHX1161

## 2023-12-17 LAB — ABO/RH: ABO/RH(D): O POS

## 2023-12-17 SURGERY — ANTERIOR CERVICAL DECOMPRESSION/DISCECTOMY FUSION 1 LEVEL
Anesthesia: General | Site: Spine Cervical

## 2023-12-17 MED ORDER — ORAL CARE MOUTH RINSE: 15.0000 mL | Freq: Once | OROMUCOSAL | Status: AC

## 2023-12-17 MED ORDER — ACETAMINOPHEN 500 MG PO TABS
1000.0000 mg | ORAL_TABLET | Freq: Once | ORAL | Status: AC
  Administered 2023-12-17: 1000 mg via ORAL
  Filled 2023-12-17: qty 2

## 2023-12-17 MED ORDER — LACTATED RINGERS IV SOLN: INTRAVENOUS | Status: DC | PRN

## 2023-12-17 MED ORDER — DEXAMETHASONE SODIUM PHOSPHATE 10 MG/ML IJ SOLN
INTRAMUSCULAR | Status: DC | PRN
  Administered 2023-12-17: 5 mg via INTRAVENOUS

## 2023-12-17 MED ORDER — CEFAZOLIN SODIUM-DEXTROSE 2-4 GM/100ML-% IV SOLN
2.0000 g | INTRAVENOUS | Status: AC
  Administered 2023-12-17: 2 g via INTRAVENOUS
  Filled 2023-12-17: qty 100

## 2023-12-17 MED ORDER — LIDOCAINE 2% (20 MG/ML) 5 ML SYRINGE
INTRAMUSCULAR | Status: DC | PRN
  Administered 2023-12-17: 80 mg via INTRAVENOUS

## 2023-12-17 MED ORDER — GABAPENTIN 100 MG PO CAPS
100.0000 mg | ORAL_CAPSULE | Freq: Three times a day (TID) | ORAL | Status: DC
  Administered 2023-12-17 – 2023-12-18 (×4): 100 mg via ORAL
  Filled 2023-12-17 (×5): qty 1

## 2023-12-17 MED ORDER — MIDAZOLAM HCL 2 MG/2ML IJ SOLN
INTRAMUSCULAR | Status: AC
Start: 2023-12-17 — End: ?
  Filled 2023-12-17: qty 2

## 2023-12-17 MED ORDER — CHLORHEXIDINE GLUCONATE CLOTH 2 % EX PADS: 6.0000 | MEDICATED_PAD | Freq: Once | CUTANEOUS | Status: DC

## 2023-12-17 MED ORDER — ROCURONIUM BROMIDE 10 MG/ML (PF) SYRINGE
PREFILLED_SYRINGE | INTRAVENOUS | Status: AC
  Filled 2023-12-17: qty 10

## 2023-12-17 MED ORDER — ONDANSETRON HCL 4 MG PO TABS: 4.0000 mg | ORAL_TABLET | Freq: Four times a day (QID) | ORAL | Status: DC | PRN

## 2023-12-17 MED ORDER — CHLORHEXIDINE GLUCONATE 0.12 % MT SOLN
15.0000 mL | Freq: Once | OROMUCOSAL | Status: AC
  Administered 2023-12-17: 15 mL via OROMUCOSAL
  Filled 2023-12-17: qty 15

## 2023-12-17 MED ORDER — CEFAZOLIN SODIUM-DEXTROSE 2-4 GM/100ML-% IV SOLN
2.0000 g | Freq: Four times a day (QID) | INTRAVENOUS | Status: AC
  Administered 2023-12-17 (×2): 2 g via INTRAVENOUS
  Filled 2023-12-17 (×2): qty 100

## 2023-12-17 MED ORDER — SODIUM CHLORIDE 0.9 % IV SOLN
250.0000 mL | INTRAVENOUS | Status: AC
  Administered 2023-12-17: 250 mL via INTRAVENOUS

## 2023-12-17 MED ORDER — ONDANSETRON HCL 4 MG/2ML IJ SOLN
INTRAMUSCULAR | Status: AC
  Filled 2023-12-17: qty 2

## 2023-12-17 MED ORDER — 0.9 % SODIUM CHLORIDE (POUR BTL) OPTIME
TOPICAL | Status: DC | PRN
  Administered 2023-12-17: 1000 mL

## 2023-12-17 MED ORDER — SODIUM CHLORIDE 0.9% FLUSH: 3.0000 mL | INTRAVENOUS | Status: DC | PRN

## 2023-12-17 MED ORDER — OXYCODONE HCL 5 MG PO TABS: 5.0000 mg | ORAL_TABLET | ORAL | Status: DC | PRN

## 2023-12-17 MED ORDER — METHOCARBAMOL 500 MG PO TABS: 500.0000 mg | ORAL_TABLET | Freq: Four times a day (QID) | ORAL | Status: DC | PRN

## 2023-12-17 MED ORDER — KETOROLAC TROMETHAMINE 15 MG/ML IJ SOLN
15.0000 mg | Freq: Four times a day (QID) | INTRAMUSCULAR | Status: AC
  Administered 2023-12-17 – 2023-12-18 (×4): 15 mg via INTRAVENOUS
  Filled 2023-12-17 (×4): qty 1

## 2023-12-17 MED ORDER — THROMBIN 5000 UNITS EX SOLR
CUTANEOUS | Status: AC
  Filled 2023-12-17: qty 5000

## 2023-12-17 MED ORDER — PROPOFOL 10 MG/ML IV BOLUS
INTRAVENOUS | Status: DC | PRN
  Administered 2023-12-17: 150 mg via INTRAVENOUS
  Administered 2023-12-17: 50 mg via INTRAVENOUS

## 2023-12-17 MED ORDER — OXYCODONE HCL 5 MG/5ML PO SOLN
5.0000 mg | Freq: Once | ORAL | Status: AC | PRN
Start: 2023-12-17 — End: 2023-12-17

## 2023-12-17 MED ORDER — SUGAMMADEX SODIUM 200 MG/2ML IV SOLN
INTRAVENOUS | Status: DC | PRN
  Administered 2023-12-17: 200 mg via INTRAVENOUS

## 2023-12-17 MED ORDER — AMISULPRIDE (ANTIEMETIC) 5 MG/2ML IV SOLN: 10.0000 mg | Freq: Once | INTRAVENOUS | Status: DC | PRN

## 2023-12-17 MED ORDER — ONDANSETRON HCL 4 MG/2ML IJ SOLN: 4.0000 mg | Freq: Four times a day (QID) | INTRAMUSCULAR | Status: DC | PRN

## 2023-12-17 MED ORDER — LAMOTRIGINE 100 MG PO TABS
100.0000 mg | ORAL_TABLET | Freq: Two times a day (BID) | ORAL | Status: DC
  Administered 2023-12-17 – 2023-12-18 (×3): 100 mg via ORAL
  Filled 2023-12-17 (×3): qty 1

## 2023-12-17 MED ORDER — FENTANYL CITRATE (PF) 250 MCG/5ML IJ SOLN
INTRAMUSCULAR | Status: AC
  Filled 2023-12-17: qty 5

## 2023-12-17 MED ORDER — ACETAMINOPHEN 325 MG PO TABS: 650.0000 mg | ORAL_TABLET | ORAL | Status: DC | PRN

## 2023-12-17 MED ORDER — OXYCODONE HCL 5 MG PO TABS
5.0000 mg | ORAL_TABLET | Freq: Once | ORAL | Status: AC | PRN
  Administered 2023-12-17: 5 mg via ORAL

## 2023-12-17 MED ORDER — PHENYLEPHRINE HCL-NACL 20-0.9 MG/250ML-% IV SOLN
INTRAVENOUS | Status: DC | PRN
  Administered 2023-12-17: 30 ug/min via INTRAVENOUS

## 2023-12-17 MED ORDER — SENNA 8.6 MG PO TABS
1.0000 | ORAL_TABLET | Freq: Two times a day (BID) | ORAL | Status: DC | PRN
  Administered 2023-12-17: 8.6 mg via ORAL
  Filled 2023-12-17 (×2): qty 1

## 2023-12-17 MED ORDER — THROMBIN 5000 UNITS EX SOLR: OROMUCOSAL | Status: DC | PRN

## 2023-12-17 MED ORDER — POLYETHYLENE GLYCOL 3350 17 G PO PACK: 17.0000 g | PACK | Freq: Every day | ORAL | Status: DC | PRN

## 2023-12-17 MED ORDER — FENTANYL CITRATE (PF) 250 MCG/5ML IJ SOLN
INTRAMUSCULAR | Status: DC | PRN
  Administered 2023-12-17 (×2): 100 ug via INTRAVENOUS
  Administered 2023-12-17: 50 ug via INTRAVENOUS

## 2023-12-17 MED ORDER — PROPOFOL 10 MG/ML IV BOLUS
INTRAVENOUS | Status: AC
  Filled 2023-12-17: qty 20

## 2023-12-17 MED ORDER — FENTANYL CITRATE (PF) 100 MCG/2ML IJ SOLN
INTRAMUSCULAR | Status: AC
  Filled 2023-12-17: qty 2

## 2023-12-17 MED ORDER — SODIUM CHLORIDE 0.9% FLUSH
3.0000 mL | Freq: Two times a day (BID) | INTRAVENOUS | Status: DC
  Administered 2023-12-17 (×2): 3 mL via INTRAVENOUS

## 2023-12-17 MED ORDER — ONDANSETRON HCL 4 MG/2ML IJ SOLN
INTRAMUSCULAR | Status: DC | PRN
  Administered 2023-12-17: 4 mg via INTRAVENOUS

## 2023-12-17 MED ORDER — HYDROMORPHONE HCL 1 MG/ML IJ SOLN: 0.5000 mg | INTRAMUSCULAR | Status: DC | PRN

## 2023-12-17 MED ORDER — PHENOL 1.4 % MT LIQD
1.0000 | OROMUCOSAL | Status: DC | PRN
  Administered 2023-12-17: 1 via OROMUCOSAL
  Filled 2023-12-17: qty 177

## 2023-12-17 MED ORDER — OXYCODONE HCL 5 MG PO TABS
ORAL_TABLET | ORAL | Status: AC
  Filled 2023-12-17: qty 1

## 2023-12-17 MED ORDER — LIDOCAINE 2% (20 MG/ML) 5 ML SYRINGE
INTRAMUSCULAR | Status: AC
  Filled 2023-12-17: qty 5

## 2023-12-17 MED ORDER — ACETAMINOPHEN 650 MG RE SUPP: 650.0000 mg | RECTAL | Status: DC | PRN

## 2023-12-17 MED ORDER — CELECOXIB 200 MG PO CAPS
200.0000 mg | ORAL_CAPSULE | Freq: Once | ORAL | Status: AC
  Administered 2023-12-17: 200 mg via ORAL
  Filled 2023-12-17: qty 1

## 2023-12-17 MED ORDER — MIDAZOLAM HCL 2 MG/2ML IJ SOLN
INTRAMUSCULAR | Status: DC | PRN
  Administered 2023-12-17: 2 mg via INTRAVENOUS

## 2023-12-17 MED ORDER — ROCURONIUM BROMIDE 10 MG/ML (PF) SYRINGE
PREFILLED_SYRINGE | INTRAVENOUS | Status: DC | PRN
  Administered 2023-12-17: 50 mg via INTRAVENOUS

## 2023-12-17 MED ORDER — DOCUSATE SODIUM 100 MG PO CAPS
100.0000 mg | ORAL_CAPSULE | Freq: Two times a day (BID) | ORAL | Status: DC
  Administered 2023-12-17 – 2023-12-18 (×3): 100 mg via ORAL
  Filled 2023-12-17 (×3): qty 1

## 2023-12-17 MED ORDER — METHOCARBAMOL 1000 MG/10ML IJ SOLN: 500.0000 mg | Freq: Four times a day (QID) | INTRAMUSCULAR | Status: DC | PRN

## 2023-12-17 MED ORDER — FENTANYL CITRATE (PF) 100 MCG/2ML IJ SOLN
25.0000 ug | INTRAMUSCULAR | Status: DC | PRN
Start: 2023-12-17 — End: 2023-12-17
  Administered 2023-12-17: 25 ug via INTRAVENOUS

## 2023-12-17 MED ORDER — DEXAMETHASONE SODIUM PHOSPHATE 10 MG/ML IJ SOLN
INTRAMUSCULAR | Status: AC
  Filled 2023-12-17: qty 1

## 2023-12-17 MED ORDER — MENTHOL 3 MG MT LOZG: 1.0000 | LOZENGE | OROMUCOSAL | Status: DC | PRN

## 2023-12-17 MED ORDER — FLEET ENEMA RE ENEM: 1.0000 | ENEMA | Freq: Once | RECTAL | Status: DC | PRN

## 2023-12-17 MED ORDER — SODIUM CHLORIDE 0.9 % IV SOLN: 12.5000 mg | INTRAVENOUS | Status: DC | PRN

## 2023-12-17 SURGICAL SUPPLY — 50 items
BAG COUNTER SPONGE SURGICOUNT (BAG) ×1 IMPLANT
BAND RUBBER #18 3X1/16 STRL (MISCELLANEOUS) ×2 IMPLANT
BENZOIN TINCTURE PRP APPL 2/3 (GAUZE/BANDAGES/DRESSINGS) IMPLANT
BIT DRILL NEURO 2X3.1 SFT TUCH (MISCELLANEOUS) ×1 IMPLANT
BLADE CLIPPER SURG (BLADE) IMPLANT
BUR CARBIDE MATCH 3.0 (BURR) ×1 IMPLANT
CANISTER SUCT 3000ML PPV (MISCELLANEOUS) ×1 IMPLANT
COVER MAYO STAND STRL (DRAPES) ×1 IMPLANT
DRAPE C-ARM 42X72 X-RAY (DRAPES) ×1 IMPLANT
DRAPE HALF SHEET 40X57 (DRAPES) IMPLANT
DRAPE LAPAROTOMY 100X72X124 (DRAPES) ×1 IMPLANT
DRAPE MICROSCOPE SLANT 54X150 (MISCELLANEOUS) ×1 IMPLANT
DRILL NEURO 2X3.1 SOFT TOUCH (MISCELLANEOUS) ×1 IMPLANT
DRSG OPSITE POSTOP 4X6 (GAUZE/BANDAGES/DRESSINGS) IMPLANT
DURAPREP 6ML APPLICATOR 50/CS (WOUND CARE) ×1 IMPLANT
ELECT COATED BLADE 2.86 ST (ELECTRODE) ×1 IMPLANT
ELECT REM PT RETURN 9FT ADLT (ELECTROSURGICAL) ×1 IMPLANT
ELECTRODE REM PT RTRN 9FT ADLT (ELECTROSURGICAL) ×1 IMPLANT
EVACUATOR 1/8 PVC DRAIN (DRAIN) IMPLANT
GAUZE 4X4 16PLY ~~LOC~~+RFID DBL (SPONGE) IMPLANT
GLOVE BIO SURGEON STRL SZ7 (GLOVE) ×1 IMPLANT
GLOVE BIOGEL PI IND STRL 7.5 (GLOVE) ×1 IMPLANT
GLOVE BIOGEL PI IND STRL 8 (GLOVE) ×1 IMPLANT
GLOVE ECLIPSE 8.0 STRL XLNG CF (GLOVE) ×2 IMPLANT
GLOVE EXAM NITRILE LRG STRL (GLOVE) IMPLANT
GLOVE EXAM NITRILE XL STR (GLOVE) IMPLANT
GLOVE EXAM NITRILE XS STR PU (GLOVE) IMPLANT
GOWN STRL REUS W/ TWL LRG LVL3 (GOWN DISPOSABLE) IMPLANT
GOWN STRL REUS W/ TWL XL LVL3 (GOWN DISPOSABLE) ×2 IMPLANT
GOWN STRL REUS W/TWL 2XL LVL3 (GOWN DISPOSABLE) IMPLANT
HEMOSTAT POWDER KIT SURGIFOAM (HEMOSTASIS) ×1 IMPLANT
KIT BASIN OR (CUSTOM PROCEDURE TRAY) ×1 IMPLANT
KIT TURNOVER KIT B (KITS) ×1 IMPLANT
NDL SPNL 18GX3.5 QUINCKE PK (NEEDLE) ×1 IMPLANT
NEEDLE SPNL 18GX3.5 QUINCKE PK (NEEDLE) ×1 IMPLANT
NS IRRIG 1000ML POUR BTL (IV SOLUTION) ×1 IMPLANT
PACK LAMINECTOMY NEURO (CUSTOM PROCEDURE TRAY) ×1 IMPLANT
PAD ARMBOARD 7.5X6 YLW CONV (MISCELLANEOUS) ×3 IMPLANT
PIN DISTRACTION 14MM (PIN) ×2 IMPLANT
PLATE ZEVO 1LVL 19MM (Plate) IMPLANT
SCREW 3.5 SELFDRILL 15MM VARI (Screw) IMPLANT
SPACER BONE CORNERSTONE 9X14 (Block) IMPLANT
SPONGE INTESTINAL PEANUT (DISPOSABLE) ×1 IMPLANT
SPONGE SURGIFOAM ABS GEL SZ50 (HEMOSTASIS) ×1 IMPLANT
STAPLER VISISTAT (STAPLE) IMPLANT
STRIP CLOSURE SKIN 1/2X4 (GAUZE/BANDAGES/DRESSINGS) ×1 IMPLANT
TAPE SURG TRANSPORE 1 IN (GAUZE/BANDAGES/DRESSINGS) ×1 IMPLANT
TOWEL GREEN STERILE (TOWEL DISPOSABLE) ×1 IMPLANT
TOWEL GREEN STERILE FF (TOWEL DISPOSABLE) ×1 IMPLANT
WATER STERILE IRR 1000ML POUR (IV SOLUTION) ×1 IMPLANT

## 2023-12-17 NOTE — H&P (Signed)
    Providing Compassionate, Quality Care - Together  NEUROSURGERY HISTORY & PHYSICAL   Jadelyn Elks is an 63 y.o. female.   Chief Complaint: Cervical radiculopathy HPI: This is a 63 year old female with progressively worsening right upper extremity radiculopathy.  She failed multiple conservative measures.  Workup revealed C5-6 degenerative spondylosis, neuroforaminal stenosis consistent with her radiculopathy.  She presents today for surgical intervention.  Past Medical History:  Diagnosis Date   COVID-19    Fibroid uterus    GERD (gastroesophageal reflux disease)    Herpes simplex viremia (HCC)    History of ovarian cyst    Seizure disorder (HCC)    Sleep apnea    wears invisiline    Past Surgical History:  Procedure Laterality Date   COLONOSCOPY WITH PROPOFOL  N/A 09/15/2017   Procedure: COLONOSCOPY WITH PROPOFOL ;  Surgeon: Therisa Bi, MD;  Location: [facility] ENDOSCOPY;  Service: Gastroenterology;  Laterality: N/A;   MYOMECTOMY     x 2    Family History  Problem Relation Age of Onset   Hypertension Mother    Hypertension Father    Seizures Father    Social History:  reports that she has never smoked. She has never used smokeless tobacco. She reports current alcohol use. She reports that she does not use drugs.  Allergies:  Allergies  Allergen Reactions   Hydrocodone-Acetaminophen  Other (See Comments)   Prednisone Other (See Comments)    Loss of eyesight   Ranitidine Hcl Other (See Comments)    Other Reaction: Not Assessed   Vicodin [Hydrocodone-Acetaminophen ]     Medications Prior to Admission  Medication Sig Dispense Refill   lamoTRIgine  (LAMICTAL ) 100 MG tablet Take 100 mg by mouth 2 (two) times daily.     gabapentin  (NEURONTIN ) 100 MG capsule Take 1 capsule (100 mg total) by mouth 3 (three) times daily. (Patient not taking: Reported on 12/09/2023) 90 capsule 2    No results found for this or any previous visit (from the past 48 hours). No results  found.  ROS All pertinent positives and negatives are listed in HPI above  Blood pressure 136/83, pulse 96, temperature 98.3 F (36.8 C), resp. rate 18, height 5' 4 (1.626 m), weight 67.6 kg, SpO2 97%. 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  We extensively went over all risks, benefits and expected outcomes as well as alternatives to treatment.  Informed consent was obtained and witnessed.   Thank you for allowing me to participate in this patient's care.  Please do not hesitate to call with questions or concerns.   Lani Meadows, DO Neurosurgeon [facility] [phone]

## 2023-12-17 NOTE — Anesthesia Procedure Notes (Signed)
 Procedure Name: Intubation Date/Time: 12/17/2023 7:45 AM  Performed by: Genny Gun, CRNAPre-anesthesia Checklist: Patient identified, Emergency Drugs available, Suction available, Patient being monitored and Timeout performed Patient Re-evaluated:Patient Re-evaluated prior to induction Oxygen Delivery Method: Circle system utilized Preoxygenation: Pre-oxygenation with 100% oxygen Induction Type: IV induction Ventilation: Mask ventilation without difficulty and Oral airway inserted - appropriate to patient size Laryngoscope Size: Glidescope and 3 Grade View: Grade I Tube type: Oral Tube size: 7.0 mm Number of attempts: 1 Placement Confirmation: ETT inserted through vocal cords under direct vision, positive ETCO2 and breath sounds checked- equal and bilateral Secured at: 23 cm Tube secured with: Tape Dental Injury: Teeth and Oropharynx as per pre-operative assessment

## 2023-12-17 NOTE — Op Note (Signed)
 Providing Compassionate, Quality Care - Together  Date of service: 12/17/2023  PREOP DIAGNOSIS: Cervical spondylosis with radiculopathy, C5-6, right foraminal stenosis with radiculopathy  POSTOP DIAGNOSIS: Same  PROCEDURE: 1. Arthrodesis C5-6, anterior interbody technique  2. Placement of intervertebral allograft device C5-6: Cornerstone 5 x 11 mm 3. Placement of anterior instrumentation consisting of interbody plate and screws -Medtronic Zevo 19 mm plate, C5: 15 mm screws bilaterally, C6: 15 mm screws bilaterally 4. Discectomy at C5-6 for decompression of spinal cord and exiting nerve roots  5. Use of intraoperative microscope 6.  Use of autograft, same incision  SURGEON: Dr. Lani Ellarae Nevitt, DO  ASSISTANT: Camie Pickle, PA  ANESTHESIA: General Endotracheal  EBL: 20 cc  SPECIMENS: None  DRAINS: None  COMPLICATIONS: None immediate  CONDITION: Hemodynamically stable to PACU  HISTORY: Dawn Francis is a 63 y.o. y.o. female who initially presented to the outpatient clinic with signs and symptoms consistent with right C6 radiculopathy.  This had been progressively worsening for many years and causing her daily aggravating pain. MRI demonstrated severe right foraminal stenosis at C5-6. Treatment options were discussed including continued physical therapy, pain control and epidural injections, she failed conservative measures therefore I offered her surgical intervention the form of ACDF C5-6.  All risks, benefits and expected outcomes were discussed and agreed upon. After all questions were answered, informed consent was obtained and witnessed.  PROCEDURE IN DETAIL: The patient was brought to the operating room and transferred to the operative table. After induction of general anesthesia, the patient was positioned on the operative table in the supine position with all pressure points meticulously padded. The skin of the neck was then prepped and draped in the usual sterile  fashion.  Physician driven timeout was performed.  After timeout was conducted, skin incision was then made sharply with a 10 blade and Bovie electrocautery was used to dissect the subcutaneous tissue until the platysma was identified. The platysma was then divided and undermined. The sternocleidomastoid muscle was then identified and, utilizing natural fascial planes in the neck, the prevertebral fascia was identified and the carotid sheath was retracted laterally and the trachea and esophagus retracted medially. Again using fluoroscopy, the correct disc space was identified. Bovie electrocautery was used to dissect in the subperiosteal plane and elevate the bilateral longus coli muscles. Self-retaining retractors were then placed under the longus coli muscles bilaterally. At this point, the microscope was draped and brought into the field, and the remainder of the case was done under the microscope using microdissecting technique.  ACDF C5-6: Distraction pins were placed in midline above and below the disc space.  The disc  space was placed in distraction.  The disc space was incised sharply and rongeurs were use to initially complete a discectomy. The high-speed drill was then used to complete discectomy until the posterior annulus was identified and removed and the posterior longitudinal ligament was identified. Using microcurettes, the PLL was elevated, and Kerrison rongeurs were used to remove the posterior longitudinal ligament and the ventral thecal sac was identified. Using a combination of curettes and ronguers, complete decompression of the thecal sac and exiting nerve roots at this level was completed, and verified using micro-nerve hook. The disc space was taken out of distraction.  Epidural hemostasis was achieved with Surgifoam.  Having completed our decompression, attention was turned to placement of the intervertebral device. Trial spacers were used to select a 5 mm graft.  This was inserted  under live fluoroscopy.  Anterior osteophytes were removed  with rongeur and utilized for autograft next to the interbody spacer.  After placement of the intervertebral device, the above anterior cervical plate was selected, and placed across the interspace. Using a high-speed drill, the cortex of the cervical vertebral bodies was punctured, and screws inserted in the level above and below with appropriate bony purchase. Final fluoroscopic images in AP and lateral projections were taken to confirm good hardware placement.  The plate was final tightened to the manufacturer's recommendation and the screws were locked in place.  At this point, after all counts were verified to be correct, meticulous hemostasis was secured using a combination of bipolar electrocautery and passive hemostatics.  Skin was closed with staples.  Sterile dressing was applied.  The patient tolerated the procedure well and was extubated in the room and taken to the postanesthesia care unit in stable condition.

## 2023-12-17 NOTE — Transfer of Care (Signed)
 Immediate Anesthesia Transfer of Care Note  Patient: Dawn Francis  Procedure(s) Performed: CERVICAL FIVE-SIX ANTERIOR CERVICAL DECOMPRESSION/DISCECTOMY FUSION (Spine Cervical)  Patient Location: PACU  Anesthesia Type:General  Level of Consciousness: awake, oriented, and patient cooperative  Airway & Oxygen Therapy: Patient Spontanous Breathing and Patient connected to face mask oxygen  Post-op Assessment: Report given to RN and Post -op Vital signs reviewed and stable  Post vital signs: Reviewed and stable  Last Vitals:  Vitals Value Taken Time  BP 132/84 12/17/23 0930  Temp 37 C 12/17/23 0930  Pulse 87 12/17/23 0935  Resp 19 12/17/23 0935  SpO2 99 % 12/17/23 0935  Vitals shown include unfiled device data.  Last Pain:  Vitals:   12/17/23 0930  PainSc: 0-No pain         Complications: No notable events documented.

## 2023-12-17 NOTE — Anesthesia Postprocedure Evaluation (Signed)
 Anesthesia Post Note  Patient: Dawn Francis  Procedure(s) Performed: CERVICAL FIVE-SIX ANTERIOR CERVICAL DECOMPRESSION/DISCECTOMY FUSION (Spine Cervical)     Patient location during evaluation: PACU Anesthesia Type: General Level of consciousness: awake and alert Pain management: pain level controlled Vital Signs Assessment: post-procedure vital signs reviewed and stable Respiratory status: spontaneous breathing, nonlabored ventilation and respiratory function stable Cardiovascular status: stable and blood pressure returned to baseline Anesthetic complications: no   No notable events documented.  Last Vitals:  Vitals:   12/17/23 1015 12/17/23 1030  BP: 99/78 119/68  Pulse: 81 82  Resp: 14 14  Temp:    SpO2: 95% 95%    Last Pain:  Vitals:   12/17/23 1007  PainSc: 6                  Debby FORBES Like

## 2023-12-18 ENCOUNTER — Other Ambulatory Visit (HOSPITAL_COMMUNITY): Payer: Self-pay

## 2023-12-18 DIAGNOSIS — M4722 Other spondylosis with radiculopathy, cervical region: Secondary | ICD-10-CM | POA: Diagnosis not present

## 2023-12-18 MED ORDER — MUPIROCIN 2 % EX OINT
1.0000 | TOPICAL_OINTMENT | Freq: Two times a day (BID) | CUTANEOUS | Status: DC
  Filled 2023-12-18: qty 22

## 2023-12-18 MED ORDER — OXYCODONE HCL 5 MG PO TABS
5.0000 mg | ORAL_TABLET | Freq: Four times a day (QID) | ORAL | 0 refills | Status: AC | PRN
  Filled 2023-12-18: qty 30, 7d supply, fill #0

## 2023-12-18 MED ORDER — CHLORHEXIDINE GLUCONATE CLOTH 2 % EX PADS: 6.0000 | MEDICATED_PAD | Freq: Every day | CUTANEOUS | Status: DC

## 2023-12-18 MED ORDER — METHOCARBAMOL 500 MG PO TABS
500.0000 mg | ORAL_TABLET | Freq: Three times a day (TID) | ORAL | 1 refills | Status: AC | PRN
  Filled 2023-12-18: qty 90, 30d supply, fill #0

## 2023-12-18 NOTE — Discharge Instructions (Signed)
  Wound Care Keep incision covered and dry until post op day 3. You may remove the Honeycomb dressing on post op day 3. Leave steri-strips on neck.  They will fall off by themselves. Do not put any creams, lotions, or ointments on incision. You are fine to shower. Let water run over incision and pat dry.  Activity Walk each and every day, increasing distance each day. No lifting greater than 8 lbs.  Avoid excessive nec motion. No driving, ride as a passenger locally  Diet Resume your normal diet.   Return to Work Will be discussed at your follow up appointment.  Call Your Doctor If Any of These Occur Redness, drainage, or swelling at the wound.  Temperature greater than 101 degrees. Severe pain not relieved by pain medication. Incision starts to come apart.  Follow Up Appt Call [phone] if you have one or any problem.

## 2023-12-18 NOTE — Discharge Summary (Signed)
  Physician Discharge Summary  Patient ID: Dawn Francis MRN: [id number] DOB/AGE: October 03, 1961 63 y.o.  Admit date: 12/17/2023 Discharge date: 12/18/2023  Admission Diagnoses:  C5-6 cervical radiculopathy, spondylosis, stenosis  Discharge Diagnoses:  Same Principal Problem:   Cervical radiculopathy   Discharged Condition: Stable  Hospital Course:  Dawn Francis is a 63 y.o. female underwent an elective ACDF C5-6.  She tolerated surgery well, her right upper extremity radiculopathy was improved postoperatively.  Her pain was controlled on oral medication.  She was ambulating independently, tolerating a normal diet with normal bowel bladder function.  Her wound was clean dry and intact  Treatments: Surgery -ACDF C5-6  Discharge Exam: Blood pressure 115/61, pulse 100, temperature 98.8 F (37.1 C), temperature source Oral, resp. rate 19, height 5' 4 (1.626 m), weight 67.6 kg, SpO2 96%. Awake, alert, oriented x 3 Speech fluent, appropriate CN grossly intact 5/5 BUE/BLE Wound c/d/I Collar in place Normal phonation, trachea midline, nonlabored breathing  Disposition: Discharge disposition: 01-Home or Self Care       Discharge Instructions     Incentive spirometry RT   Complete by: As directed       Allergies as of 12/18/2023       Reactions   Hydrocodone-acetaminophen  Other (See Comments)   Prednisone Other (See Comments)   Loss of eyesight   Ranitidine Hcl Other (See Comments)   Other Reaction: Not Assessed   Vicodin [hydrocodone-acetaminophen ]         Medication List     TAKE these medications    gabapentin  100 MG capsule Commonly known as: NEURONTIN  Take 1 capsule (100 mg total) by mouth 3 (three) times daily.   lamoTRIgine  100 MG tablet Commonly known as: LAMICTAL  Take 100 mg by mouth 2 (two) times daily.   methocarbamol  500 MG tablet Commonly known as: ROBAXIN  Take 1 tablet (500 mg total) by mouth every 8 (eight) hours as needed for muscle  spasms.   oxyCODONE  5 MG immediate release tablet Commonly known as: Oxy IR/ROXICODONE  Take 1 tablet (5 mg total) by mouth every 6 (six) hours as needed for moderate pain (pain score 4-6) or severe pain (pain score 7-10).         SignedBETHA Lani BROCKS Toluwani Ruder 12/18/2023, 8:18 AM

## 2023-12-18 NOTE — Progress Notes (Signed)
 PT Cancellation Note and Discharge  Patient Details Name: Dawn Francis MRN: [id number] DOB: Sep 24, 1961   Cancelled Treatment:    Reason Eval/Treat Not Completed: PT screened, no needs identified, will sign off. Discussed pt case with OT who reports pt is currently mobilizing at a modified independent level and does not require a formal PT evaluation at this time. PT signing off. If needs change, please reconsult.     [name] 12/18/2023, 10:30 AM  [name], PT, DPT Acute Rehabilitation Services Secure Chat Preferred Office: [phone]

## 2023-12-18 NOTE — Progress Notes (Signed)
 Patient alert and oriented, ambulate, void. Surgical site clean and dry no sign of infections. D/c instructions explain and given to the patient.

## 2023-12-18 NOTE — Evaluation (Signed)
 Occupational Therapy Evaluation Patient Details Name: Dawn Francis MRN: [id number] DOB: 11-17-1960 Today's Date: 12/18/2023   History of Present Illness Patient is a 63 year old female presenting to the hospital for C5-C6 ACDF on 12/17/23. PMH includes: GERD, herpes, and seizure disorder (HCC)   Clinical Impression   Prior to this admission, patient independent, driving, and working in insurance account manager at [employer]. Patient did not use DME. Currently, patient with 5/10 neck pain and a cough, but able to complete bed mobility, transfer, and ADLs at [facility] level. Cervical precaution handout provided and adhered to throughout session. Patient did report blurred vision in far sighted vision, but states it is improving slowly. OT completing visual assessment with no acute abnormalities noted. OT verbally reviewing car transfers and patient able to complete a flight of stairs at mod I level (patients apartment is on the second floor). Patient does not require further acute OT services, and does not require OT at discharge. OT will sign off at this time, please re-consult if further actue needs arise.       If plan is discharge home, recommend the following: Assist for transportation;Assistance with cooking/housework (initially)    Functional Status Assessment  Patient has had a recent decline in their functional status and demonstrates the ability to make significant improvements in function in a reasonable and predictable amount of time.  Equipment Recommendations  None recommended by OT    Recommendations for Other Services       Precautions / Restrictions Precautions Precautions: Cervical Precaution Booklet Issued: Yes (comment) Precaution Comments: Verbally reviewed and handout provided Required Braces or Orthoses: Cervical Brace Cervical Brace: Hard collar;At all times Restrictions Weight Bearing Restrictions Per Provider Order: No      Mobility Bed Mobility Overal bed mobility: Needs  Assistance Bed Mobility: Rolling, Sidelying to Sit Rolling: Contact guard assist Sidelying to sit: Contact guard assist       General bed mobility comments: good adherence with cervical precautions    Transfers Overall transfer level: Needs assistance Equipment used: None Transfers: Sit to/from Stand Sit to Stand: Contact guard assist           General transfer comment: CGA for safety, able to ambulate in hallway and complete flight of stairs at mod I      Balance Overall balance assessment: Needs assistance Sitting-balance support: No upper extremity supported, Feet supported Sitting balance-Leahy Scale: Good     Standing balance support: No upper extremity supported, During functional activity Standing balance-Leahy Scale: Good                             ADL either performed or assessed with clinical judgement   ADL Overall ADL's : Needs assistance/impaired Eating/Feeding: Set up;Sitting   Grooming: Set up;Sitting;Standing   Upper Body Bathing: Set up;Sitting   Lower Body Bathing: Contact guard assist;Sit to/from stand;Sitting/lateral leans   Upper Body Dressing : Set up;Sitting   Lower Body Dressing: Contact guard assist;Sit to/from stand;Sitting/lateral leans   Toilet Transfer: Contact guard assist;Ambulation   Toileting- Clothing Manipulation and Hygiene: Contact guard assist;Sit to/from stand;Sitting/lateral lean       Functional mobility during ADLs: Contact guard assist;Cueing for sequencing;Cueing for safety General ADL Comments: Prior to this admission, patient independent, driving, and working in insurance account manager at [employer]. Patient did not use DME. Currently, patient with 5/10 neck pain and a cough, but able to complete bed mobility, transfer, and ADLs at [facility] level. Cervical precaution  handout provided and adhered to throughout session. OT verbally reviewing car transfers and patient able to complete a flight of stairs at mod I level  (patients apartment is on the second floor). Patient does not require further acute OT services, and does not require OT at discharge. OT will sign off at this time, please re-consult if further actue needs arise.     Vision Baseline Vision/History: 1 Wears glasses (Readers) Ability to See in Adequate Light: 0 Adequate Patient Visual Report: Blurring of vision Vision Assessment?: Yes Eye Alignment: Within Functional Limits Ocular Range of Motion: Within Functional Limits Alignment/Gaze Preference: Within Defined Limits Tracking/Visual Pursuits: Able to track stimulus in all quads without difficulty Saccades: Within functional limits Convergence: Within functional limits Visual Fields: No apparent deficits Additional Comments: Blurred vision when looking at handouts up close     Perception Perception: Not tested       Praxis Praxis: Not tested       Pertinent Vitals/Pain Pain Assessment Pain Assessment: 0-10 Pain Score: 5  Pain Location: neck Pain Descriptors / Indicators: Discomfort, Grimacing, Guarding Pain Intervention(s): Limited activity within patient's tolerance, Monitored during session, Repositioned     Extremity/Trunk Assessment Upper Extremity Assessment Upper Extremity Assessment: Overall WFL for tasks assessed   Lower Extremity Assessment Lower Extremity Assessment: Overall WFL for tasks assessed   Cervical / Trunk Assessment Cervical / Trunk Assessment: Neck Surgery   Communication Communication Communication: No apparent difficulties Cueing Techniques: Verbal cues   Cognition Arousal: Alert Behavior During Therapy: WFL for tasks assessed/performed Overall Cognitive Status: Within Functional Limits for tasks assessed                                       General Comments  VSS    Exercises     Shoulder Instructions      Home Living Family/patient expects to be discharged to:: Private residence Living Arrangements:  Alone Available Help at Discharge: Available PRN/intermittently;Family Type of Home: Apartment Home Access: Stairs to enter [employer] of Steps: 13-14 Entrance Stairs-Rails: Left Home Layout: One level     Bathroom Shower/Tub: Chief Strategy Officer: Standard     Home Equipment: None          Prior Functioning/Environment Prior Level of Function : Independent/Modified Independent;Working/employed;Driving             Mobility Comments: independent ADLs Comments: driving, 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        OT Problem List: Decreased activity tolerance;Impaired vision/perception;Pain      OT Treatment/Interventions:      OT Goals(Current goals can be found in the care plan section) Acute Rehab OT Goals Patient Stated Goal: to get better OT Goal Formulation: With patient Time For Goal Achievement: 01/01/24 Potential to Achieve Goals: Good  OT Frequency:      Co-evaluation              AM-PAC OT 6 Clicks Daily Activity     Outcome Measure Help from another person eating meals?: A Little Help from another person taking care of personal grooming?: A Little Help from another person toileting, which includes using toliet, bedpan, or urinal?: A Little Help from another person bathing (including washing, rinsing, drying)?: A Little Help from another person to put on and taking off regular upper body clothing?: A Little Help from another person to put on and taking off regular lower  body clothing?: A Little 6 Click Score: 18   End of Session Equipment Utilized During Treatment: Gait belt;Cervical collar Nurse Communication: Mobility status  Activity Tolerance: Patient tolerated treatment well Patient left: in bed;with call bell/phone within reach  OT Visit Diagnosis: Unsteadiness on feet (R26.81);Muscle weakness (generalized) (M62.81);Pain                Time: 9164-9093 OT Time Calculation (min): 31 min Charges:  OT General  Charges $OT Visit: 1 Visit OT Evaluation $OT Eval Moderate Complexity: 1 Mod OT Treatments $Self Care/Home Management : 8-22 mins  Ronal Gift E. Timber Marshman, OTR/L Acute Rehabilitation Services [phone]   Ronal Gift Salt 12/18/2023, 10:16 AM

## 2023-12-21 ENCOUNTER — Ambulatory Visit: Payer: Self-pay

## 2023-12-21 NOTE — Telephone Encounter (Signed)
  Chief Complaint: Mild URI Symptoms: Sniffles  mild cough Frequency: 2 weeks Pertinent Negatives: Patient denies fever,  Disposition: [] ED /[] Urgent Care (no appt availability in office) / [] Appointment(In office/virtual)/ []  [facility]/ [x] Home Care/ [] Refused Recommended Disposition /[] [place] [facility]/ []  Follow-up with PCP Additional Notes: Returned pt's call. Pt is wanting home care advice for URI. Reviewed home care advice. Also suggested that pt call pharmacy for OTC medications. PT is on a lot of medications post sx. Pt may also call her Pcp for suggestions.    Summary: OTC Medication Advice   Pt is calling to report that she just had surgery. Would like to know what OTC medication can she take for a cold along with prescribtion medications? Please advise       Reason for Disposition  Cough with cold symptoms (e.g., runny nose, postnasal drip, throat clearing)  Answer Assessment - Initial Assessment Questions 1. ONSET: "When did the cough begin?"      2 weeks 2. SEVERITY: "How bad is the cough today?"      cough 3. SPUTUM: "Describe the color of your sputum" (none, dry cough; clear, white, yellow, green)     clear 4. HEMOPTYSIS: "Are you coughing up any blood?" If so ask: "How much?" (flecks, streaks, tablespoons, etc.)     no 5. DIFFICULTY BREATHING: "Are you having difficulty breathing?" If Yes, ask: "How bad is it?" (e.g., mild, moderate, severe)    - MILD: No SOB at rest, mild SOB with walking, speaks normally in sentences, can lie down, no retractions, pulse < 100.    - MODERATE: SOB at rest, SOB with minimal exertion and prefers to sit, cannot lie down flat, speaks in phrases, mild retractions, audible wheezing, pulse 100-120.    - SEVERE: Very SOB at rest, speaks in single words, struggling to breathe, sitting hunched forward, retractions, pulse > 120      mild 6. FEVER: "Do you have a fever?" If Yes, ask: "What is your temperature, how was it  measured, and when did it start?"     no 7. CARDIAC HISTORY: "Do you have any history of heart disease?" (e.g., heart attack, congestive heart failure)      no 8. LUNG HISTORY: "Do you have any history of lung disease?"  (e.g., pulmonary embolus, asthma, emphysema)     no 9. PE RISK FACTORS: "Do you have a history of blood clots?" (or: recent major surgery, recent prolonged travel, bedridden)     no 10. OTHER SYMPTOMS: "Do you have any other symptoms?" (e.g., runny nose, wheezing, chest pain)       Runny nose, cough  Protocols used: Cough - Acute Productive-A-AH

## 2023-12-23 ENCOUNTER — Encounter (HOSPITAL_COMMUNITY): Payer: Self-pay | Admitting: Neurological Surgery

## 2024-12-13 ENCOUNTER — Other Ambulatory Visit: Payer: Self-pay | Admitting: Surgery
